# Patient Record
Sex: Male | Born: 1980 | Race: White | Hispanic: No | Marital: Married | Smoking: Never smoker
Health system: Southern US, Community
[De-identification: ages and names within clinical notes are randomized; demographics above are authoritative.]

## PROBLEM LIST (undated history)

## (undated) DIAGNOSIS — G8929 Other chronic pain: Secondary | ICD-10-CM

## (undated) DIAGNOSIS — M549 Dorsalgia, unspecified: Secondary | ICD-10-CM

## (undated) DIAGNOSIS — I10 Essential (primary) hypertension: Secondary | ICD-10-CM

## (undated) DIAGNOSIS — F419 Anxiety disorder, unspecified: Secondary | ICD-10-CM

## (undated) DIAGNOSIS — M542 Cervicalgia: Secondary | ICD-10-CM

## (undated) HISTORY — PX: BACK SURGERY: SHX140

## (undated) HISTORY — PX: CARPAL TUNNEL RELEASE: SHX101

## (undated) HISTORY — PX: ULNAR NERVE TRANSPOSITION: SHX2595

## (undated) HISTORY — PX: CERVICAL SPINE SURGERY: SHX589

---

## 2008-10-20 ENCOUNTER — Encounter: Admission: RE | Admit: 2008-10-20 | Discharge: 2008-10-20 | Payer: Self-pay | Admitting: Orthopaedic Surgery

## 2008-11-09 ENCOUNTER — Inpatient Hospital Stay (HOSPITAL_COMMUNITY): Admission: RE | Admit: 2008-11-09 | Discharge: 2008-11-10 | Payer: Self-pay | Admitting: Orthopedic Surgery

## 2008-11-09 ENCOUNTER — Encounter (INDEPENDENT_AMBULATORY_CARE_PROVIDER_SITE_OTHER): Payer: Self-pay | Admitting: Orthopedic Surgery

## 2010-01-23 IMAGING — CR DG CERVICAL SPINE 2 OR 3 VIEWS
1 series · 1 of 1 positions shown · non-contrast
Comparison: CT scan of the cervical spine dated 10/20/2008

CLINICAL DATA: Degenerative disc disease at C6-7 and C7-T1.

CERVICAL SPINE - 2-3 VIEW

[view not recorded]
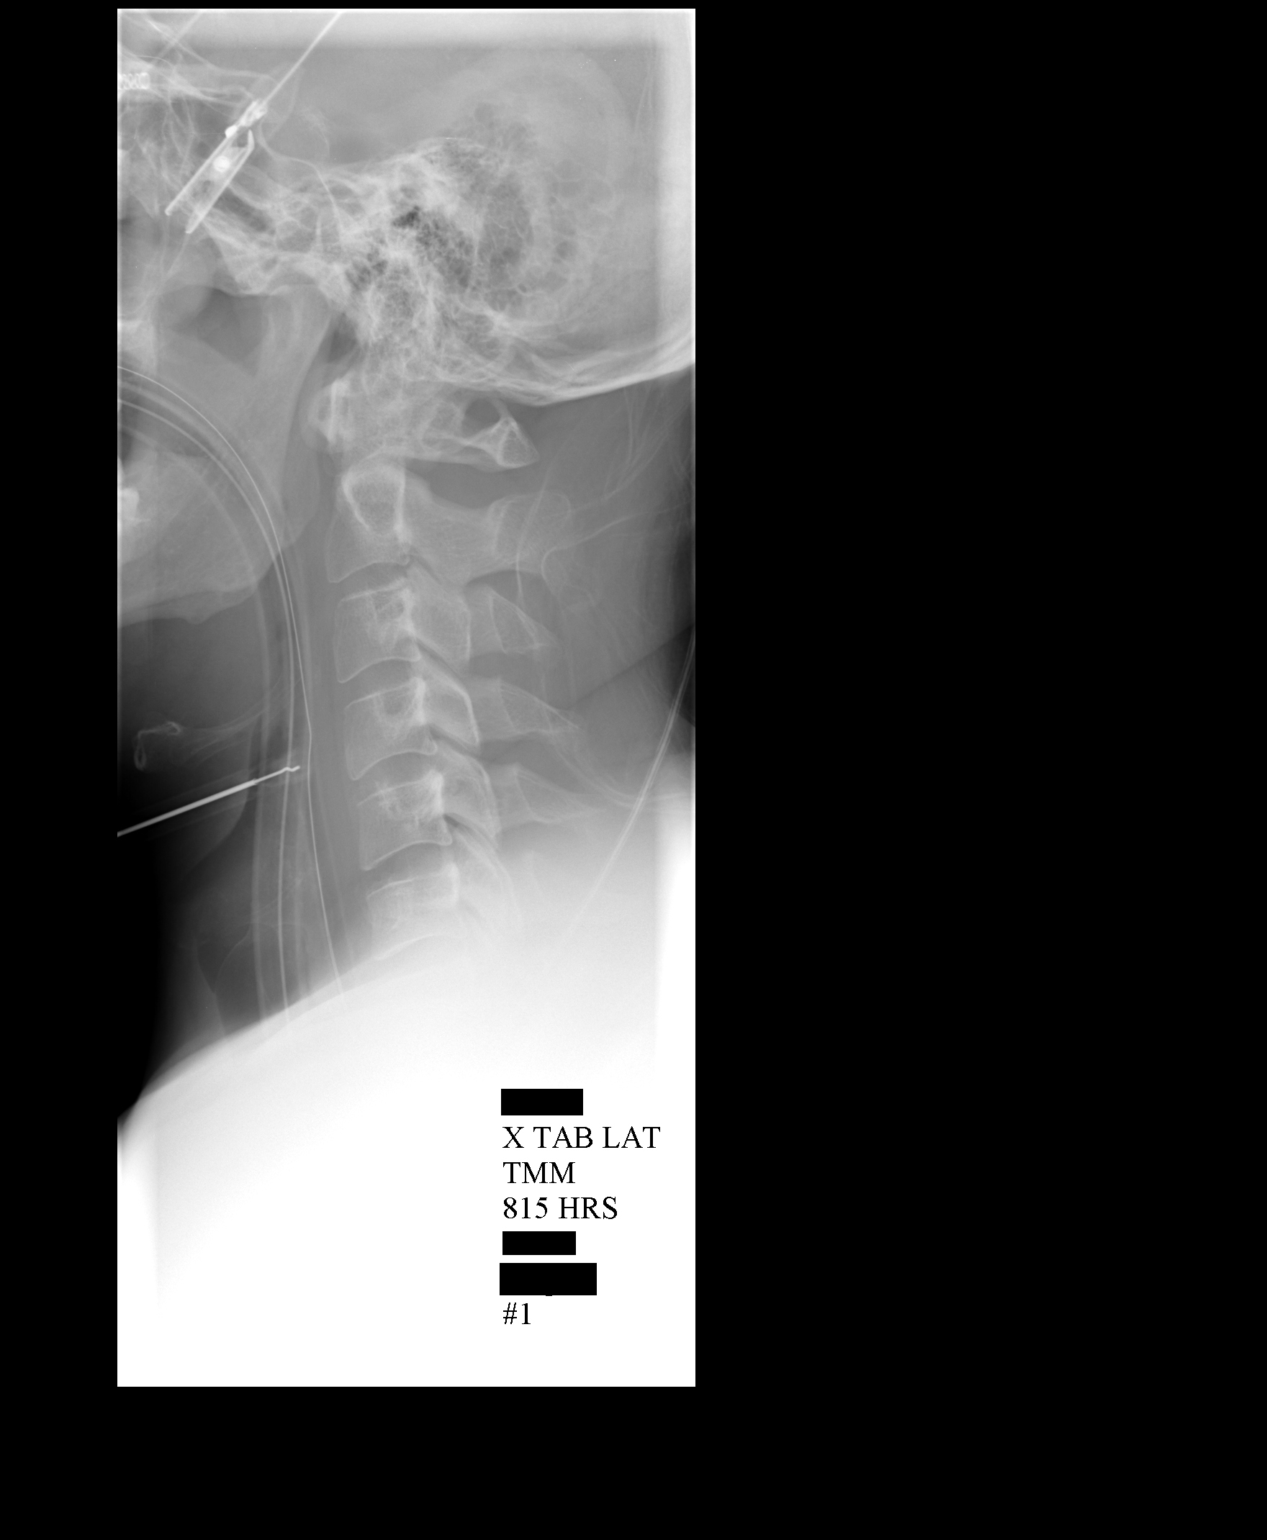

[1 of 1 positions shown; findings below may reference images not displayed]

FINDINGS: Radiograph #1 demonstrates a needle at the C4-5 level.
Radiograph #2 demonstrates a needle at the C7 level.  Radiograph #3
demonstrates anterior cervical fusion at C6-7.  The C7-T1 level is
not visible on this radiograph.
IMPRESSION: Anterior cervical fusion at C6-7.  C7-T1 level is not visible on
the radiographs.

## 2010-07-08 LAB — TYPE AND SCREEN
ABO/RH(D): O POS
Antibody Screen: NEGATIVE

## 2010-07-08 LAB — CBC
HCT: 45.2 % (ref 39.0–52.0)
MCHC: 34.3 g/dL (ref 30.0–36.0)
MCHC: 34.8 g/dL (ref 30.0–36.0)
MCV: 90 fL (ref 78.0–100.0)
MCV: 91.1 fL (ref 78.0–100.0)
Platelets: 189 10*3/uL (ref 150–400)
Platelets: 234 10*3/uL (ref 150–400)
RBC: 4.36 MIL/uL (ref 4.22–5.81)
RDW: 12.6 % (ref 11.5–15.5)
RDW: 12.9 % (ref 11.5–15.5)

## 2010-07-08 LAB — BASIC METABOLIC PANEL
BUN: 6 mg/dL (ref 6–23)
CO2: 30 mEq/L (ref 19–32)
Calcium: 8.7 mg/dL (ref 8.4–10.5)
Chloride: 107 mEq/L (ref 96–112)
Creatinine, Ser: 0.81 mg/dL (ref 0.4–1.5)
GFR calc Af Amer: >60 mL/min (ref >60)

## 2010-07-08 LAB — COMPREHENSIVE METABOLIC PANEL
Albumin: 4.4 g/dL (ref 3.5–5.2)
BUN: 9 mg/dL (ref 6–23)
Calcium: 9.6 mg/dL (ref 8.4–10.5)
Creatinine, Ser: 1.07 mg/dL (ref 0.4–1.5)
Glucose, Bld: 94 mg/dL (ref 70–99)
Total Protein: 7 g/dL (ref 6.0–8.3)

## 2010-07-08 LAB — DIFFERENTIAL
Basophils Absolute: 0.1 10*3/uL (ref 0.0–0.1)
Lymphocytes Relative: 35 % (ref 12–46)
Monocytes Absolute: 0.8 10*3/uL (ref 0.1–1.0)
Monocytes Relative: 12 % (ref 3–12)
Neutro Abs: 3.4 10*3/uL (ref 1.7–7.7)
Neutrophils Relative %: 49 % (ref 43–77)

## 2010-07-08 LAB — URINALYSIS, ROUTINE W REFLEX MICROSCOPIC
Glucose, UA: NEGATIVE mg/dL
Hgb urine dipstick: NEGATIVE
Protein, ur: NEGATIVE mg/dL
Specific Gravity, Urine: 1.026 (ref 1.005–1.030)
Urobilinogen, UA: 1 mg/dL (ref 0.0–1.0)

## 2010-08-15 NOTE — Discharge Summary (Signed)
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   BRIEF HISTORY:  The patient was brought into [facility] on  November 09, 2008, for anterior decompression fusion C6-7, C7-T1 with  iliac crest bone graft harvest.  Estimated blood loss 150 mL.  Postoperatively, he was grossly neurovascularly motor intact.  He has  decreased sensation in left C8 distribution.  Postop day #1, he did have  difficulty with independent voiding after the Foley was discontinued  approximately 5 p.m. on November 09, 2008.  They re-cathetered Mr.  Juhnke.  Today, we will try dose of Flomax 0.4 mg daily, continue IV  fluids until voiding is successful independently, if so we will  discontinue the IV fluids and discontinue IV access.  He is grossly  neurovascularly motor intact this morning, still has decreased sensation  in C8 distribution down to the fifth digit on the left upper extremity.  Incision is clean, dry, and intact.  We discontinued the drain, only 10  mL of drainage output since surgery, put a compression dressing over the  drain hole.  He is instructed to leave that in place for 24 hours and  then he may shower.  He is afebrile.  Vital signs were stable.  His  vitamin D level is within the normal range of 50.  Iliac crest graft  harvest site is clean, dry, and intact.  No dressing is needed at this  point.  Steri-Strips in place on both incisions.  Active range of motion  of upper extremities is intact and grip strength is grossly 5/5.   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   PLAN:  He will follow up in our office in 1 week.   Diet is soft for 6  days, then regular as tolerated.  He may shower after 24 hours removing  dressing prior.  He will be discharged home today if he can  independently void and follow up in our office in approximately 1 week.  We want him to walk for exercise.  No bending, stooping, lifting.  I am  sending him home with prescriptions to include Percocet 10, 1-2 q.4  p.r.n. for breakthrough pain and OxyContin 20 one p.o. q.12 p.r.n. for  pain, and I gave him 150 of the Percocet and 60 of the OxyContin.  When  he gets low on the Percocet, he will call his regular medical doctor for  refills on the Percocet.  He is going to continue all of his home  medications as directed.      Lianne Cure, P.A.      Nelda Severe, MD  Electronically Signed    MC/MEDQ  D:  11/10/2008  T:  11/10/2008  Job:  [id number]

## 2010-08-15 NOTE — Op Note (Signed)
Craig Glover, Craig Glover                ACCOUNT NO.:  [id number]   MEDICAL RECORD NO.:  [id number]          PATIENT TYPE:  INP   LOCATION:  5040                         FACILITY:  [facility]   PHYSICIAN:  Nelda Severe, MD      DATE OF BIRTH:  17-Jan-1981   DATE OF PROCEDURE:  11/09/2008  DATE OF DISCHARGE:                               OPERATIVE REPORT   SURGEON:  Nelda Severe, MD   ASSISTANT:  Lianne Cure, PA-C   PREOPERATIVE DIAGNOSES:  C7-T1 left foraminal stenosis, possible disk  herniation; C6-7 annular tear.   POSTOPERATIVE DIAGNOSES:  C7-T1 foraminal stenosis, C6-7 annular tear.   OPERATIVE FINDINGS:  At C7-C1, I did not identify disk fragment.  However, there was a severe foraminal stenosis secondary to joint  hypertrophy of the left uncovertebral joint.  At C6-7, there was no  identifiable disk herniation, but there was interestingly a large rent  in the annulus posteriorly with a gap of 2-3 mm, through which no disk  had extruded.   OPERATIVE PROCEDURES:  Anterior C7-T1 decompression with foraminotomy,  left side and C7-T1 interbody fusion, C6-7 interbody fusion using PEEK  cages, autograft, titanium plate and screws; harvest autograft, left  anterior iliac crest.   OPERATIVE NOTE:  The patient was placed under general endotracheal  anesthesia.  The antibiotics had been infused intravenously as  prophylaxis for infection.  He was positioned supine on the [place] flat  top radiolucent table.  The head was supported on a foam donut.  The  arms were tucked at the sides.  The left anterior iliac crest area was  draped after preparation with DuraPrep.  The anterior cervical area was  prepped with DuraPrep and the drapes secured with Ioban.   Prior to placing the drapes, I had taped a spinal needle to the skin of  the neck on the left side and marked position of the needle with a skin  marker.  Cross-table lateral radiograph was taken, which showed the  needle to be at about  C4 position.   The time-out was held after preparation and draping.  The usual  information was confirmed.   A slightly oblique incision was made from midline to a point about the 3-  4 cm lateral on the left side and about 2 cm above the sternal notch.  The sternocleidomastoid muscle was identified after incising through the  platysma.  The anterior border was dissected out and the deep cervical  fascia bluntly dissected down onto the anterior aspect of the vertebral  column.  The patient was very muscular large male, and the wound was  actually quite deep.  A needle was placed in the disk, and a cross-table  lateral radiograph taken.  The last disk which was clearly visible on  the cross-table radiograph was C6-7, and the needle appeared to be in  the disk below.  Ultimately, another radiograph was taken with 2 marker  needles, 1 in what proved to be C6-7 and 1 in the level below, which  could not be identified very clearly on the radiographs.  This was  discussed with Dr. Lacy Duverney, radiologist who was reviewing the images  currently.   We started at the C7-T1 level.  The longus colli muscles were mobilized  bilaterally.  The deepest blades from the Shadow-Line retractor were  used to retract the midline structures to the right and the carotid  sheath and sternocleidomastoid to the left.  Caspar pins were placed at  T1 and C7, and the distractor applied, and tension applied to the disk.  The anterior disk was incised, and the contents curetted out and removed  with a combination of pituitary rongeurs and Kerrison rongeur.  When we  had worked our way back to the posterior portion of the endplates above  and below, high-speed bur was used to bur away some of the endplate  particularly on the left side.  Further curettage was carried out, and  we were able to enter the spinal canal.  A 2-mm Kerrison rongeur was  then used to remove some of the posterior lip of the vertebra on the  left  side.  Ultimately, we were able to exposed dura and worked our way  laterally.  The neural foramen was very tight, would not admit a nerve  hook.  We then proceeded very tediously to remove bone and expand the  neural foramen using a 2-mm Kerrison rongeur.  Ultimately, the neural  foramen appeared adequately decompressed on the basis of palpation with  a nerve hook.  At this point, efforts to further decompress were ceased  because I felt that we were very far lateral and really not able to  visualize very well the bone that we were removing.  The endplates were  further prepared by denuding them of cartilage.  A 7-mm thick lordotic  PEEK spacer was then chosen.   Not mentioned above was that we had harvested bone graft in the left  anterior iliac crest while waiting for x-ray processing.  This was done  by making a short oblique incision just distal to the crest, 2-3 inches  behind the posterior superior iliac spine.  Dissection was carried down  onto the crest.  A window was made in the crest with a high-speed bur  and then satisfactory amount of cancellous bone graft removed from  between the tables of the ilium with a curette.  The defect was packed  with a piece of Gelfoam.  The external oblique fascia was reopposed  using a figure-of-eight 0 Vicryl suture.  The subcutaneous tissue was  closed using inverted 2-0 undyed Vicryl, and the skin was closed using  subcuticular 3-0 running undyed Vicryl suture.  Ultimately, skin edges  were reinforced with Steri-Strips, and antibiotic ointment dressing  applied at the end of the procedure.   Having loaded the cage for C7-T1, it was gently impacted and countersunk  about 2 mm.  The distractor was let off and the endplates gripped the  cage nicely.   We then moved to the C6-7, one space proximally.  This required some  more mobilization of the longus colli muscles and replacement of the  retractor, along with removing the Caspar distraction  pins at C7 and T1.  Caspar distractor pins were replaced after placing a Shadow-Line  retractor at C6-7, the C7 pin in the previously made hole and the C6 pin  in a new hole in the midbody of the C6.  Distraction was applied.  The  annulus was incised.  The disk was enucleated with curettes.  As we  enucleated the posterior  portion of the disk, it was obvious there was a  rent in the annulus, as detected on the MRI scan, and its appearance as  described above.  The endplates were prepared by denuding them of  cartilage.  A 7-mm spacer, PEEK cage (was chosen) and packed with bone  graft and impacted into place gently and countersunk about 1-2 mm.  Extra bone was packed laterally at both levels.  We then chose an  appropriate length titanium plate and contoured it for lordosis.  It was  then attached with titanium screws, 2 at C6, 2 at C7, and 2 at T1.  Cross-table lateral radiograph showed only the C6 body and the screws at  that level appeared to be in good position, and this did confirm that we  were at the right levels.   Having set all the screws, so they would interlock with the plate, we  placed a 7-gauge Silastic drain and brought it out through the skin to  the right side.  The platysma layer was closed using interrupted #2-0  Vicryl suture.  The skin was closed using subcuticular 3-0 undyed Vicryl  in running fashion.  The drain was secured with a 4-0 nylon suture.  The  skin edges were reinforced with Steri-Strips and dressing applied to be  secured with a surgical towel.   Blood loss estimated at 150 mL or less.  No intraoperative  complications.  The patient has not been examined postoperatively as he  has not been transferred to recovery room yet at the time of this  dictation.      Nelda Severe, MD  Electronically Signed     MT/MEDQ  D:  11/09/2008  T:  11/09/2008  Job:  [id number]

## 2013-12-27 ENCOUNTER — Emergency Department (HOSPITAL_COMMUNITY)
Admission: EM | Admit: 2013-12-27 | Discharge: 2013-12-27 | Disposition: A | Discharge status: Home / Self Care | Payer: No Typology Code available for payment source | Attending: Emergency Medicine | Admitting: Emergency Medicine

## 2013-12-27 ENCOUNTER — Emergency Department (HOSPITAL_COMMUNITY): Payer: No Typology Code available for payment source

## 2013-12-27 ENCOUNTER — Encounter (HOSPITAL_COMMUNITY): Payer: Self-pay | Admitting: Emergency Medicine

## 2013-12-27 DIAGNOSIS — S01501A Unspecified open wound of lip, initial encounter: Secondary | ICD-10-CM | POA: Insufficient documentation

## 2013-12-27 DIAGNOSIS — Y9389 Activity, other specified: Secondary | ICD-10-CM | POA: Diagnosis not present

## 2013-12-27 DIAGNOSIS — T148XXA Other injury of unspecified body region, initial encounter: Secondary | ICD-10-CM

## 2013-12-27 DIAGNOSIS — IMO0002 Reserved for concepts with insufficient information to code with codable children: Secondary | ICD-10-CM | POA: Diagnosis not present

## 2013-12-27 DIAGNOSIS — S139XXA Sprain of joints and ligaments of unspecified parts of neck, initial encounter: Secondary | ICD-10-CM | POA: Insufficient documentation

## 2013-12-27 DIAGNOSIS — Y92410 Unspecified street and highway as the place of occurrence of the external cause: Secondary | ICD-10-CM | POA: Insufficient documentation

## 2013-12-27 DIAGNOSIS — Z9889 Other specified postprocedural states: Secondary | ICD-10-CM | POA: Diagnosis not present

## 2013-12-27 DIAGNOSIS — I10 Essential (primary) hypertension: Secondary | ICD-10-CM | POA: Diagnosis not present

## 2013-12-27 DIAGNOSIS — S161XXA Strain of muscle, fascia and tendon at neck level, initial encounter: Secondary | ICD-10-CM

## 2013-12-27 DIAGNOSIS — G8929 Other chronic pain: Secondary | ICD-10-CM | POA: Diagnosis not present

## 2013-12-27 DIAGNOSIS — S1093XA Contusion of unspecified part of neck, initial encounter: Secondary | ICD-10-CM | POA: Diagnosis not present

## 2013-12-27 DIAGNOSIS — S0990XA Unspecified injury of head, initial encounter: Secondary | ICD-10-CM | POA: Diagnosis present

## 2013-12-27 DIAGNOSIS — S060X0A Concussion without loss of consciousness, initial encounter: Secondary | ICD-10-CM | POA: Insufficient documentation

## 2013-12-27 DIAGNOSIS — S0003XA Contusion of scalp, initial encounter: Secondary | ICD-10-CM | POA: Diagnosis not present

## 2013-12-27 DIAGNOSIS — S01511A Laceration without foreign body of lip, initial encounter: Secondary | ICD-10-CM

## 2013-12-27 DIAGNOSIS — S0083XA Contusion of other part of head, initial encounter: Secondary | ICD-10-CM | POA: Insufficient documentation

## 2013-12-27 HISTORY — DX: Essential (primary) hypertension: I10

## 2013-12-27 HISTORY — DX: Cervicalgia: M54.2

## 2013-12-27 HISTORY — DX: Dorsalgia, unspecified: M54.9

## 2013-12-27 HISTORY — DX: Anxiety disorder, unspecified: F41.9

## 2013-12-27 HISTORY — DX: Other chronic pain: G89.29

## 2013-12-27 LAB — CBC
HEMATOCRIT: 49.9 % (ref 39.0–52.0)
HEMOGLOBIN: 17.6 g/dL — AB (ref 13.0–17.0)
MCH: 31 pg (ref 26.0–34.0)
MCHC: 35.3 g/dL (ref 30.0–36.0)
MCV: 87.9 fL (ref 78.0–100.0)
Platelets: 269 10*3/uL (ref 150–400)
RBC: 5.68 MIL/uL (ref 4.22–5.81)
RDW: 13.4 % (ref 11.5–15.5)
WBC: 10.9 10*3/uL — ABNORMAL HIGH (ref 4.0–10.5)

## 2013-12-27 LAB — BASIC METABOLIC PANEL
Anion gap: 13 (ref 5–15)
BUN: 12 mg/dL (ref 6–23)
CALCIUM: 9.3 mg/dL (ref 8.4–10.5)
CO2: 25 mEq/L (ref 19–32)
Chloride: 102 mEq/L (ref 96–112)
Creatinine, Ser: 1.11 mg/dL (ref 0.50–1.35)
GFR calc Af Amer: >90 mL/min (ref >90)
GFR, EST NON AFRICAN AMERICAN: 86 mL/min — AB (ref >90)
GLUCOSE: 98 mg/dL (ref 70–99)
Potassium: 4.6 mEq/L (ref 3.7–5.3)
Sodium: 140 mEq/L (ref 137–147)

## 2013-12-27 MED ORDER — HYDROMORPHONE HCL 1 MG/ML IJ SOLN
1.0000 mg | Freq: Once | INTRAMUSCULAR | Status: AC
  Administered 2013-12-27: 1 mg via INTRAVENOUS
  Filled 2013-12-27: qty 1

## 2013-12-27 MED ORDER — DIAZEPAM 5 MG PO TABS: 5.0000 mg | ORAL_TABLET | Freq: Three times a day (TID) | ORAL | Status: DC | PRN

## 2013-12-27 MED ORDER — IBUPROFEN 800 MG PO TABS: 800.0000 mg | ORAL_TABLET | Freq: Three times a day (TID) | ORAL | Status: AC

## 2013-12-27 MED ORDER — CLINDAMYCIN HCL 150 MG PO CAPS: 300.0000 mg | ORAL_CAPSULE | Freq: Three times a day (TID) | ORAL | Status: AC

## 2013-12-27 MED ORDER — LIDOCAINE HCL (PF) 1 % IJ SOLN
5.0000 mL | Freq: Once | INTRAMUSCULAR | Status: AC
  Administered 2013-12-27: 5 mL via INTRADERMAL
  Filled 2013-12-27: qty 5

## 2013-12-27 MED ORDER — OXYCODONE-ACETAMINOPHEN 5-325 MG PO TABS: 2.0000 | ORAL_TABLET | Freq: Four times a day (QID) | ORAL | Status: AC | PRN

## 2013-12-27 MED ORDER — KETOROLAC TROMETHAMINE 30 MG/ML IJ SOLN
30.0000 mg | Freq: Once | INTRAMUSCULAR | Status: AC
  Administered 2013-12-27: 30 mg via INTRAVENOUS
  Filled 2013-12-27: qty 1

## 2013-12-27 MED ORDER — FENTANYL CITRATE 0.05 MG/ML IJ SOLN
100.0000 ug | Freq: Once | INTRAMUSCULAR | Status: AC
  Administered 2013-12-27: 100 ug via INTRAVENOUS
  Filled 2013-12-27: qty 2

## 2013-12-27 MED ORDER — DIAZEPAM 5 MG/ML IJ SOLN
5.0000 mg | Freq: Once | INTRAMUSCULAR | Status: AC
  Administered 2013-12-27: 5 mg via INTRAVENOUS
  Filled 2013-12-27: qty 2
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
## 2013-12-27 NOTE — Discharge Instructions (Signed)
You need to follow-up with your primary care provider in 5 days for suture removal. You need to keep the cervical collar in place until seen by orthopedics for follow-up (3-5 days)  Concussion A concussion, or closed-head injury, is a brain injury caused by a direct blow to the head or by a quick and sudden movement (jolt) of the head or neck. Concussions are usually not life-threatening. Even so, the effects of a concussion can be serious. If you have had a concussion before, you are more likely to experience concussion-like symptoms after a direct blow to the head.  CAUSES  Direct blow to the head, such as from running into another player during a soccer game, being hit in a fight, or hitting your head on a hard surface.  A jolt of the head or neck that causes the brain to move back and forth inside the skull, such as in a car crash. SIGNS AND SYMPTOMS The signs of a concussion can be hard to notice. Early on, they may be missed by you, family members, and health care providers. You may look fine but act or feel differently. Symptoms are usually temporary, but they may last for days, weeks, or even longer. Some symptoms may appear right away while others may not show up for hours or days. Every head injury is different. Symptoms include:  Mild to moderate headaches that will not go away.  A feeling of pressure inside your head.  Having more trouble than usual:  Learning or remembering things you have heard.  Answering questions.  Paying attention or concentrating.  Organizing daily tasks.  Making decisions and solving problems.  Slowness in thinking, acting or reacting, speaking, or reading.  Getting lost or being easily confused.  Feeling tired all the time or lacking energy (fatigued).  Feeling drowsy.  Sleep disturbances.  Sleeping more than usual.  Sleeping less than usual.  Trouble falling asleep.  Trouble sleeping (insomnia).  Loss of balance or feeling  lightheaded or dizzy.  Nausea or vomiting.  Numbness or tingling.  Increased sensitivity to:  Sounds.  Lights.  Distractions.  Vision problems or eyes that tire easily.  Diminished sense of taste or smell.  Ringing in the ears.  Mood changes such as feeling sad or anxious.  Becoming easily irritated or angry for little or no reason.  Lack of motivation.  Seeing or hearing things other people do not see or hear (hallucinations). DIAGNOSIS Your health care provider can usually diagnose a concussion based on a description of your injury and symptoms. He or she will ask whether you passed out (lost consciousness) and whether you are having trouble remembering events that happened right before and during your injury. Your evaluation might include:  A brain scan to look for signs of injury to the brain. Even if the test shows no injury, you may still have a concussion.  Blood tests to be sure other problems are not present. TREATMENT  Concussions are usually treated in an emergency department, in urgent care, or at a clinic. You may need to stay in the hospital overnight for further treatment.  Tell your health care provider if you are taking any medicines, including prescription medicines, over-the-counter medicines, and natural remedies. Some medicines, such as blood thinners (anticoagulants) and aspirin, may increase the chance of complications. Also tell your health care provider whether you have had alcohol or are taking illegal drugs. This information may affect treatment.  Your health care provider will send you home with  important instructions to follow.  How fast you will recover from a concussion depends on many factors. These factors include how severe your concussion is, what part of your brain was injured, your age, and how healthy you were before the concussion.  Most people with mild injuries recover fully. Recovery can take time. In general, recovery is slower in  older persons. Also, persons who have had a concussion in the past or have other medical problems may find that it takes longer to recover from their current injury. HOME CARE INSTRUCTIONS General Instructions  Carefully follow the directions your health care provider gave you.  Only take over-the-counter or prescription medicines for pain, discomfort, or fever as directed by your health care provider.  Take only those medicines that your health care provider has approved.  Do not drink alcohol until your health care provider says you are well enough to do so. Alcohol and certain other drugs may slow your recovery and can put you at risk of further injury.  If it is harder than usual to remember things, write them down.  If you are easily distracted, try to do one thing at a time. For example, do not try to watch TV while fixing dinner.  Talk with family members or close friends when making important decisions.  Keep all follow-up appointments. Repeated evaluation of your symptoms is recommended for your recovery.  Watch your symptoms and tell others to do the same. Complications sometimes occur after a concussion. Older adults with a brain injury may have a higher risk of serious complications, such as a blood clot on the brain.  Tell your teachers, school nurse, school counselor, coach, athletic trainer, or work Production designer, theatre/television/filmmanager about your injury, symptoms, and restrictions. Tell them about what you can or cannot do. They should watch for:  Increased problems with attention or concentration.  Increased difficulty remembering or learning new information.  Increased time needed to complete tasks or assignments.  Increased irritability or decreased ability to cope with stress.  Increased symptoms.  Rest. Rest helps the brain to heal. Make sure you:  Get plenty of sleep at night. Avoid staying up late at night.  Keep the same bedtime hours on weekends and weekdays.  Rest during the day.  Take daytime naps or rest breaks when you feel tired.  Limit activities that require a lot of thought or concentration. These include:  Doing homework or job-related work.  Watching TV.  Working on the computer.  Avoid any situation where there is potential for another head injury (football, hockey, soccer, basketball, martial arts, downhill snow sports and horseback riding). Your condition will get worse every time you experience a concussion. You should avoid these activities until you are evaluated by the appropriate follow-up health care providers. Returning To Your Regular Activities You will need to return to your normal activities slowly, not all at once. You must give your body and brain enough time for recovery.  Do not return to sports or other athletic activities until your health care provider tells you it is safe to do so.  Ask your health care provider when you can drive, ride a bicycle, or operate heavy machinery. Your ability to react may be slower after a brain injury. Never do these activities if you are dizzy.  Ask your health care provider about when you can return to work or school. Preventing Another Concussion It is very important to avoid another brain injury, especially before you have recovered. In rare cases, another  injury can lead to permanent brain damage, brain swelling, or death. The risk of this is greatest during the first 7-10 days after a head injury. Avoid injuries by:  Wearing a seat belt when riding in a car.  Drinking alcohol only in moderation.  Wearing a helmet when biking, skiing, skateboarding, skating, or doing similar activities.  Avoiding activities that could lead to a second concussion, such as contact or recreational sports, until your health care provider says it is okay.  Taking safety measures in your home.  Remove clutter and tripping hazards from floors and stairways.  Use grab bars in bathrooms and handrails by stairs.  Place  non-slip mats on floors and in bathtubs.  Improve lighting in dim areas. SEEK MEDICAL CARE IF:  You have increased problems paying attention or concentrating.  You have increased difficulty remembering or learning new information.  You need more time to complete tasks or assignments than before.  You have increased irritability or decreased ability to cope with stress.  You have more symptoms than before. Seek medical care if you have any of the following symptoms for more than 2 weeks after your injury:  Lasting (chronic) headaches.  Dizziness or balance problems.  Nausea.  Vision problems.  Increased sensitivity to noise or light.  Depression or mood swings.  Anxiety or irritability.  Memory problems.  Difficulty concentrating or paying attention.  Sleep problems.  Feeling tired all the time. SEEK IMMEDIATE MEDICAL CARE IF:  You have severe or worsening headaches. These may be a sign of a blood clot in the brain.  You have weakness (even if only in one hand, leg, or part of the face).  You have numbness.  You have decreased coordination.  You vomit repeatedly.  You have increased sleepiness.  One pupil is larger than the other.  You have convulsions.  You have slurred speech.  You have increased confusion. This may be a sign of a blood clot in the brain.  You have increased restlessness, agitation, or irritability.  You are unable to recognize people or places.  You have neck pain.  It is difficult to wake you up.  You have unusual behavior changes.  You lose consciousness. MAKE SURE YOU:  Understand these instructions.  Will watch your condition.  Will get help right away if you are not doing well or get worse. Document Released: 06/09/2003 Document Revised: 03/24/2013 Document Reviewed: 10/09/2012 [facility] Patient Information 2015 [place], [place]. This information is not intended to replace advice given to you by your health care  provider. Make sure you discuss any questions you have with your health care provider. Open Wound, Lip An open wound is a break in the skin caused by an injury. An open wound to the lip can be a scrape, cut, or hole (puncture). Good wound care will help:   Lessen pain.  Prevent infection.  Lessen scarring. HOME CARE  Wash off all dirt.  Clean your wounds daily with gentle soap and water.  Eat soft foods or liquids while your wound is healing.  Rinse the wound with salt water after each meal.  Apply medicated cream after the wound has been cleaned as told by your doctor.  Follow up with your doctor as told. GET HELP RIGHT AWAY IF:   There is more redness or puffiness (swelling) in or around the wound.  There is increasing pain.  You have a temperature by mouth above 102 F (38.9 C), not controlled by medicine.  Your baby  is older than 3 months with a rectal temperature of 102 F (38.9 C) or higher.  Your baby is 35 months old or younger with a rectal temperature of 100.4 F (38 C) or higher.  There is yellowish white fluid (pus) coming from the wound.  Very bad pain develops that is not controlled with medicine.  There is a red line on the skin above or below the wound. MAKE SURE YOU:   Understand these instructions.  Will watch this condition.  Will get help right away if you are not doing well or get worse. Document Released: 06/15/2008 Document Revised: 06/11/2011 Document Reviewed: 07/05/2009 [facility] Patient Information 2015 [place], [place]. This information is not intended to replace advice given to you by your health care provider. Make sure you discuss any questions you have with your health care provider. Cervical Strain and Sprain (Whiplash) with Rehab Cervical strain and sprain are injuries that commonly occur with "whiplash" injuries. Whiplash occurs when the neck is forcefully whipped backward or forward, such as during a motor vehicle accident or  during contact sports. The muscles, ligaments, tendons, discs, and nerves of the neck are susceptible to injury when this occurs. RISK FACTORS Risk of having a whiplash injury increases if:  Osteoarthritis of the spine.  Situations that make head or neck accidents or trauma more likely.  High-risk sports (football, rugby, wrestling, hockey, auto racing, gymnastics, diving, contact karate, or boxing).  Poor strength and flexibility of the neck.  Previous neck injury.  Poor tackling technique.  Improperly fitted or padded equipment. SYMPTOMS   Pain or stiffness in the front or back of neck or both.  Symptoms may present immediately or up to 24 hours after injury.  Dizziness, headache, nausea, and vomiting.  Muscle spasm with soreness and stiffness in the neck.  Tenderness and swelling at the injury site. PREVENTION  Learn and use proper technique (avoid tackling with the head, spearing, and head-butting; use proper falling techniques to avoid landing on the head).  Warm up and stretch properly before activity.  Maintain physical fitness:  Strength, flexibility, and endurance.  Cardiovascular fitness.  Wear properly fitted and padded protective equipment, such as padded soft collars, for participation in contact sports. PROGNOSIS  Recovery from cervical strain and sprain injuries is dependent on the extent of the injury. These injuries are usually curable in 1 week to 3 months with appropriate treatment.  RELATED COMPLICATIONS   Temporary numbness and weakness may occur if the nerve roots are damaged, and this may persist until the nerve has completely healed.  Chronic pain due to frequent recurrence of symptoms.  Prolonged healing, especially if activity is resumed too soon (before complete recovery). TREATMENT  Treatment initially involves the use of ice and medication to help reduce pain and inflammation. It is also important to perform strengthening and stretching  exercises and modify activities that worsen symptoms so the injury does not get worse. These exercises may be performed at home or with a therapist. For patients who experience severe symptoms, a soft, padded collar may be recommended to be worn around the neck.  Improving your posture may help reduce symptoms. Posture improvement includes pulling your chin and abdomen in while sitting or standing. If you are sitting, sit in a firm chair with your buttocks against the back of the chair. While sleeping, try replacing your pillow with a small towel rolled to 2 inches in diameter, or use a cervical pillow or soft cervical collar. Poor sleeping positions delay  healing.  For patients with nerve root damage, which causes numbness or weakness, the use of a cervical traction apparatus may be recommended. Surgery is rarely necessary for these injuries. However, cervical strain and sprains that are present at birth (congenital) may require surgery. MEDICATION   If pain medication is necessary, nonsteroidal anti-inflammatory medications, such as aspirin and ibuprofen, or other minor pain relievers, such as acetaminophen, are often recommended.  Do not take pain medication for 7 days before surgery.  Prescription pain relievers may be given if deemed necessary by your caregiver. Use only as directed and only as much as you need. HEAT AND COLD:   Cold treatment (icing) relieves pain and reduces inflammation. Cold treatment should be applied for 10 to 15 minutes every 2 to 3 hours for inflammation and pain and immediately after any activity that aggravates your symptoms. Use ice packs or an ice massage.  Heat treatment may be used prior to performing the stretching and strengthening activities prescribed by your caregiver, physical therapist, or athletic trainer. Use a heat pack or a warm soak. SEEK MEDICAL CARE IF:   Symptoms get worse or do not improve in 2 weeks despite treatment.  New, unexplained symptoms  develop (drugs used in treatment may produce side effects). EXERCISES RANGE OF MOTION (ROM) AND STRETCHING EXERCISES - Cervical Strain and Sprain These exercises may help you when beginning to rehabilitate your injury. In order to successfully resolve your symptoms, you must improve your posture. These exercises are designed to help reduce the forward-head and rounded-shoulder posture which contributes to this condition. Your symptoms may resolve with or without further involvement from your physician, physical therapist or athletic trainer. While completing these exercises, remember:   Restoring tissue flexibility helps normal motion to return to the joints. This allows healthier, less painful movement and activity.  An effective stretch should be held for at least 20 seconds, although you may need to begin with shorter hold times for comfort.  A stretch should never be painful. You should only feel a gentle lengthening or release in the stretched tissue. STRETCH- Axial Extensors  Lie on your back on the floor. You may bend your knees for comfort. Place a rolled-up hand towel or dish towel, about 2 inches in diameter, under the part of your head that makes contact with the floor.  Gently tuck your chin, as if trying to make a "double chin," until you feel a gentle stretch at the base of your head.  Hold __________ seconds. Repeat __________ times. Complete this exercise __________ times per day.  STRETCH - Axial Extension   Stand or sit on a firm surface. Assume a good posture: chest up, shoulders drawn back, abdominal muscles slightly tense, knees unlocked (if standing) and feet hip width apart.  Slowly retract your chin so your head slides back and your chin slightly lowers. Continue to look straight ahead.  You should feel a gentle stretch in the back of your head. Be certain not to feel an aggressive stretch since this can cause headaches later.  Hold for __________ seconds. Repeat  __________ times. Complete this exercise __________ times per day. STRETCH - Cervical Side Bend   Stand or sit on a firm surface. Assume a good posture: chest up, shoulders drawn back, abdominal muscles slightly tense, knees unlocked (if standing) and feet hip width apart.  Without letting your nose or shoulders move, slowly tip your right / left ear to your shoulder until your feel a gentle stretch in the  muscles on the opposite side of your neck.  Hold __________ seconds. Repeat __________ times. Complete this exercise __________ times per day. STRETCH - Cervical Rotators   Stand or sit on a firm surface. Assume a good posture: chest up, shoulders drawn back, abdominal muscles slightly tense, knees unlocked (if standing) and feet hip width apart.  Keeping your eyes level with the ground, slowly turn your head until you feel a gentle stretch along the back and opposite side of your neck.  Hold __________ seconds. Repeat __________ times. Complete this exercise __________ times per day. RANGE OF MOTION - Neck Circles   Stand or sit on a firm surface. Assume a good posture: chest up, shoulders drawn back, abdominal muscles slightly tense, knees unlocked (if standing) and feet hip width apart.  Gently roll your head down and around from the back of one shoulder to the back of the other. The motion should never be forced or painful.  Repeat the motion 10-20 times, or until you feel the neck muscles relax and loosen. Repeat __________ times. Complete the exercise __________ times per day. STRENGTHENING EXERCISES - Cervical Strain and Sprain These exercises may help you when beginning to rehabilitate your injury. They may resolve your symptoms with or without further involvement from your physician, physical therapist, or athletic trainer. While completing these exercises, remember:   Muscles can gain both the endurance and the strength needed for everyday activities through controlled  exercises.  Complete these exercises as instructed by your physician, physical therapist, or athletic trainer. Progress the resistance and repetitions only as guided.  You may experience muscle soreness or fatigue, but the pain or discomfort you are trying to eliminate should never worsen during these exercises. If this pain does worsen, stop and make certain you are following the directions exactly. If the pain is still present after adjustments, discontinue the exercise until you can discuss the trouble with your clinician. STRENGTH - Cervical Flexors, Isometric  Face a wall, standing about 6 inches away. Place a small pillow, a ball about 6-8 inches in diameter, or a folded towel between your forehead and the wall.  Slightly tuck your chin and gently push your forehead into the soft object. Push only with mild to moderate intensity, building up tension gradually. Keep your jaw and forehead relaxed.  Hold 10 to 20 seconds. Keep your breathing relaxed.  Release the tension slowly. Relax your neck muscles completely before you start the next repetition. Repeat __________ times. Complete this exercise __________ times per day. STRENGTH- Cervical Lateral Flexors, Isometric   Stand about 6 inches away from a wall. Place a small pillow, a ball about 6-8 inches in diameter, or a folded towel between the side of your head and the wall.  Slightly tuck your chin and gently tilt your head into the soft object. Push only with mild to moderate intensity, building up tension gradually. Keep your jaw and forehead relaxed.  Hold 10 to 20 seconds. Keep your breathing relaxed.  Release the tension slowly. Relax your neck muscles completely before you start the next repetition. Repeat __________ times. Complete this exercise __________ times per day. STRENGTH - Cervical Extensors, Isometric   Stand about 6 inches away from a wall. Place a small pillow, a ball about 6-8 inches in diameter, or a folded towel  between the back of your head and the wall.  Slightly tuck your chin and gently tilt your head back into the soft object. Push only with mild to moderate intensity,  building up tension gradually. Keep your jaw and forehead relaxed.  Hold 10 to 20 seconds. Keep your breathing relaxed.  Release the tension slowly. Relax your neck muscles completely before you start the next repetition. Repeat __________ times. Complete this exercise __________ times per day. POSTURE AND BODY MECHANICS CONSIDERATIONS - Cervical Strain and Sprain Keeping correct posture when sitting, standing or completing your activities will reduce the stress put on different body tissues, allowing injured tissues a chance to heal and limiting painful experiences. The following are general guidelines for improved posture. Your physician or physical therapist will provide you with any instructions specific to your needs. While reading these guidelines, remember:  The exercises prescribed by your provider will help you have the flexibility and strength to maintain correct postures.  The correct posture provides the optimal environment for your joints to work. All of your joints have less wear and tear when properly supported by a spine with good posture. This means you will experience a healthier, less painful body.  Correct posture must be practiced with all of your activities, especially prolonged sitting and standing. Correct posture is as important when doing repetitive low-stress activities (typing) as it is when doing a single heavy-load activity (lifting). PROLONGED STANDING WHILE SLIGHTLY LEANING FORWARD When completing a task that requires you to lean forward while standing in one place for a long time, place either foot up on a stationary 2- to 4-inch high object to help maintain the best posture. When both feet are on the ground, the low back tends to lose its slight inward curve. If this curve flattens (or becomes too  large), then the back and your other joints will experience too much stress, fatigue more quickly, and can cause pain.  RESTING POSITIONS Consider which positions are most painful for you when choosing a resting position. If you have pain with flexion-based activities (sitting, bending, stooping, squatting), choose a position that allows you to rest in a less flexed posture. You would want to avoid curling into a fetal position on your side. If your pain worsens with extension-based activities (prolonged standing, working overhead), avoid resting in an extended position such as sleeping on your stomach. Most people will find more comfort when they rest with their spine in a more neutral position, neither too rounded nor too arched. Lying on a non-sagging bed on your side with a pillow between your knees, or on your back with a pillow under your knees will often provide some relief. Keep in mind, being in any one position for a prolonged period of time, no matter how correct

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
## 2013-12-27 NOTE — ED Notes (Signed)
Milky drainage noted in patients right eye. Melina Schools, PA notified.
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
## 2013-12-28 NOTE — ED Provider Notes (Signed)
Medical screening examination/treatment/procedure(s) were performed by non-physician practitioner and as supervising physician I was immediately available for consultation/collaboration.   EKG Interpretation None       Hendy Brindle, MD 12/28/13 0144 
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
## 2013-12-30 ENCOUNTER — Encounter (HOSPITAL_COMMUNITY): Payer: Self-pay | Admitting: Emergency Medicine

## 2013-12-30 ENCOUNTER — Emergency Department (HOSPITAL_COMMUNITY)
Admission: EM | Admit: 2013-12-30 | Discharge: 2013-12-30 | Disposition: A | Discharge status: Home / Self Care | Payer: No Typology Code available for payment source | Attending: Emergency Medicine | Admitting: Emergency Medicine

## 2013-12-30 ENCOUNTER — Emergency Department (HOSPITAL_COMMUNITY): Payer: No Typology Code available for payment source

## 2013-12-30 DIAGNOSIS — F411 Generalized anxiety disorder: Secondary | ICD-10-CM | POA: Diagnosis not present

## 2013-12-30 DIAGNOSIS — I10 Essential (primary) hypertension: Secondary | ICD-10-CM | POA: Diagnosis not present

## 2013-12-30 DIAGNOSIS — Y9389 Activity, other specified: Secondary | ICD-10-CM | POA: Diagnosis not present

## 2013-12-30 DIAGNOSIS — Z792 Long term (current) use of antibiotics: Secondary | ICD-10-CM | POA: Insufficient documentation

## 2013-12-30 DIAGNOSIS — Z9889 Other specified postprocedural states: Secondary | ICD-10-CM | POA: Diagnosis not present

## 2013-12-30 DIAGNOSIS — S99929A Unspecified injury of unspecified foot, initial encounter: Secondary | ICD-10-CM | POA: Diagnosis present

## 2013-12-30 DIAGNOSIS — Z79899 Other long term (current) drug therapy: Secondary | ICD-10-CM | POA: Diagnosis not present

## 2013-12-30 DIAGNOSIS — Z880 Allergy status to penicillin: Secondary | ICD-10-CM | POA: Insufficient documentation

## 2013-12-30 DIAGNOSIS — Y92410 Unspecified street and highway as the place of occurrence of the external cause: Secondary | ICD-10-CM | POA: Insufficient documentation

## 2013-12-30 DIAGNOSIS — S0993XA Unspecified injury of face, initial encounter: Secondary | ICD-10-CM | POA: Insufficient documentation

## 2013-12-30 DIAGNOSIS — S8990XA Unspecified injury of unspecified lower leg, initial encounter: Secondary | ICD-10-CM | POA: Diagnosis present

## 2013-12-30 DIAGNOSIS — Z791 Long term (current) use of non-steroidal anti-inflammatories (NSAID): Secondary | ICD-10-CM | POA: Diagnosis not present

## 2013-12-30 DIAGNOSIS — S199XXA Unspecified injury of neck, initial encounter: Secondary | ICD-10-CM

## 2013-12-30 DIAGNOSIS — IMO0002 Reserved for concepts with insufficient information to code with codable children: Secondary | ICD-10-CM | POA: Insufficient documentation

## 2013-12-30 DIAGNOSIS — S99919A Unspecified injury of unspecified ankle, initial encounter: Secondary | ICD-10-CM

## 2013-12-30 DIAGNOSIS — R0602 Shortness of breath: Secondary | ICD-10-CM | POA: Diagnosis not present

## 2013-12-30 DIAGNOSIS — S298XXA Other specified injuries of thorax, initial encounter: Secondary | ICD-10-CM | POA: Diagnosis not present

## 2013-12-30 DIAGNOSIS — M7989 Other specified soft tissue disorders: Secondary | ICD-10-CM

## 2013-12-30 DIAGNOSIS — T148XXA Other injury of unspecified body region, initial encounter: Secondary | ICD-10-CM | POA: Insufficient documentation

## 2013-12-30 LAB — CBC
HEMATOCRIT: 50.7 % (ref 39.0–52.0)
Hemoglobin: 17.3 g/dL — ABNORMAL HIGH (ref 13.0–17.0)
MCH: 30.8 pg (ref 26.0–34.0)
MCHC: 34.1 g/dL (ref 30.0–36.0)
MCV: 90.4 fL (ref 78.0–100.0)
Platelets: 307 10*3/uL (ref 150–400)
RBC: 5.61 MIL/uL (ref 4.22–5.81)
RDW: 13.3 % (ref 11.5–15.5)
WBC: 8.3 10*3/uL (ref 4.0–10.5)

## 2013-12-30 LAB — BASIC METABOLIC PANEL
Anion gap: 11 (ref 5–15)
BUN: 12 mg/dL (ref 6–23)
CO2: 28 mEq/L (ref 19–32)
CREATININE: 1.33 mg/dL (ref 0.50–1.35)
Calcium: 9.1 mg/dL (ref 8.4–10.5)
Chloride: 102 mEq/L (ref 96–112)
GFR calc Af Amer: 80 mL/min — ABNORMAL LOW (ref >90)
GFR, EST NON AFRICAN AMERICAN: 69 mL/min — AB (ref >90)
GLUCOSE: 86 mg/dL (ref 70–99)
POTASSIUM: 4.5 meq/L (ref 3.7–5.3)
Sodium: 141 mEq/L (ref 137–147)

## 2013-12-30 LAB — I-STAT TROPONIN, ED: TROPONIN I, POC: 0 ng/mL (ref 0.00–0.08)

## 2013-12-30 LAB — PRO B NATRIURETIC PEPTIDE: Pro B Natriuretic peptide (BNP): <5 pg/mL (ref 0–125)

## 2013-12-30 LAB — D-DIMER, QUANTITATIVE: D-Dimer, Quant: <0.27 ug/mL-FEU (ref 0.00–0.48)

## 2013-12-30 MED ORDER — DIAZEPAM 5 MG PO TABS: ORAL_TABLET | ORAL | Status: AC

## 2013-12-30 MED ORDER — HYDROMORPHONE HCL 1 MG/ML IJ SOLN
1.0000 mg | Freq: Once | INTRAMUSCULAR | Status: AC
  Administered 2013-12-30: 1 mg via INTRAVENOUS
  Filled 2013-12-30: qty 1

## 2013-12-30 MED ORDER — ONDANSETRON HCL 4 MG/2ML IJ SOLN
4.0000 mg | Freq: Once | INTRAMUSCULAR | Status: AC
  Administered 2013-12-30: 4 mg via INTRAVENOUS
  Filled 2013-12-30: qty 2
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
## 2013-12-30 NOTE — ED Provider Notes (Signed)
Medical screening examination/treatment/procedure(s) were performed by non-physician practitioner and as supervising physician I was immediately available for consultation/collaboration.   EKG Interpretation   Date/Time:  Wednesday December 30 2013 11:36:36 EDT Ventricular Rate:  73 PR Interval:  136 QRS Duration: 130 QT Interval:  356 QTC Calculation: 392 R Axis:   -32 Text Interpretation:  Normal sinus rhythm Left axis deviation Right bundle  branch block Moderate voltage criteria for LVH, may be normal variant  Abnormal ECG Similar to prior Confirmed by Gwendolyn GrantWALDEN  MD, Analiza Cowger ([id number]) on  12/30/2013 1:08:34 PM        Craig MochaBlair Lashanta Elbe, MD 12/30/13 1408

## 2014-07-06 DIAGNOSIS — M5442 Lumbago with sciatica, left side: Secondary | ICD-10-CM | POA: Diagnosis not present

## 2014-07-06 DIAGNOSIS — I10 Essential (primary) hypertension: Secondary | ICD-10-CM | POA: Diagnosis not present

## 2014-07-20 DIAGNOSIS — R0602 Shortness of breath: Secondary | ICD-10-CM | POA: Diagnosis not present

## 2014-07-20 DIAGNOSIS — M25559 Pain in unspecified hip: Secondary | ICD-10-CM | POA: Diagnosis not present

## 2014-07-20 DIAGNOSIS — M545 Low back pain: Secondary | ICD-10-CM | POA: Diagnosis not present

## 2014-07-20 DIAGNOSIS — M5430 Sciatica, unspecified side: Secondary | ICD-10-CM | POA: Diagnosis not present

## 2014-07-23 DIAGNOSIS — M545 Low back pain: Secondary | ICD-10-CM | POA: Diagnosis not present

## 2014-07-23 DIAGNOSIS — M4806 Spinal stenosis, lumbar region: Secondary | ICD-10-CM | POA: Diagnosis not present

## 2014-07-23 DIAGNOSIS — M5430 Sciatica, unspecified side: Secondary | ICD-10-CM | POA: Diagnosis not present

## 2014-07-23 DIAGNOSIS — M5135 Other intervertebral disc degeneration, thoracolumbar region: Secondary | ICD-10-CM | POA: Diagnosis not present

## 2014-07-24 DIAGNOSIS — L03116 Cellulitis of left lower limb: Secondary | ICD-10-CM | POA: Diagnosis not present

## 2014-07-26 DIAGNOSIS — I10 Essential (primary) hypertension: Secondary | ICD-10-CM | POA: Diagnosis not present

## 2014-07-26 DIAGNOSIS — R1012 Left upper quadrant pain: Secondary | ICD-10-CM | POA: Diagnosis not present

## 2014-07-26 DIAGNOSIS — L03116 Cellulitis of left lower limb: Secondary | ICD-10-CM | POA: Diagnosis not present

## 2014-07-26 DIAGNOSIS — R079 Chest pain, unspecified: Secondary | ICD-10-CM | POA: Diagnosis not present

## 2014-07-26 DIAGNOSIS — R918 Other nonspecific abnormal finding of lung field: Secondary | ICD-10-CM | POA: Diagnosis not present

## 2014-07-26 DIAGNOSIS — J9601 Acute respiratory failure with hypoxia: Secondary | ICD-10-CM | POA: Diagnosis not present

## 2014-07-26 DIAGNOSIS — L03115 Cellulitis of right lower limb: Secondary | ICD-10-CM | POA: Diagnosis not present

## 2014-07-26 DIAGNOSIS — N179 Acute kidney failure, unspecified: Secondary | ICD-10-CM | POA: Diagnosis not present

## 2014-07-26 DIAGNOSIS — J189 Pneumonia, unspecified organism: Secondary | ICD-10-CM | POA: Diagnosis not present

## 2014-07-26 DIAGNOSIS — K219 Gastro-esophageal reflux disease without esophagitis: Secondary | ICD-10-CM | POA: Diagnosis not present

## 2014-07-26 DIAGNOSIS — R600 Localized edema: Secondary | ICD-10-CM | POA: Diagnosis not present

## 2014-07-27 DIAGNOSIS — N179 Acute kidney failure, unspecified: Secondary | ICD-10-CM | POA: Diagnosis not present

## 2014-07-27 DIAGNOSIS — M5489 Other dorsalgia: Secondary | ICD-10-CM | POA: Diagnosis not present

## 2014-07-27 DIAGNOSIS — M5432 Sciatica, left side: Secondary | ICD-10-CM | POA: Diagnosis not present

## 2014-07-27 DIAGNOSIS — Z79899 Other long term (current) drug therapy: Secondary | ICD-10-CM | POA: Diagnosis not present

## 2014-07-27 DIAGNOSIS — J189 Pneumonia, unspecified organism: Secondary | ICD-10-CM | POA: Diagnosis not present

## 2014-07-27 DIAGNOSIS — G8929 Other chronic pain: Secondary | ICD-10-CM | POA: Diagnosis not present

## 2014-07-27 DIAGNOSIS — R600 Localized edema: Secondary | ICD-10-CM | POA: Diagnosis not present

## 2014-07-27 DIAGNOSIS — R1012 Left upper quadrant pain: Secondary | ICD-10-CM | POA: Diagnosis not present

## 2014-07-27 DIAGNOSIS — K219 Gastro-esophageal reflux disease without esophagitis: Secondary | ICD-10-CM | POA: Diagnosis not present

## 2014-07-27 DIAGNOSIS — R0602 Shortness of breath: Secondary | ICD-10-CM | POA: Diagnosis not present

## 2014-07-27 DIAGNOSIS — Z880 Allergy status to penicillin: Secondary | ICD-10-CM | POA: Diagnosis not present

## 2014-07-27 DIAGNOSIS — L600 Ingrowing nail: Secondary | ICD-10-CM | POA: Diagnosis present

## 2014-07-27 DIAGNOSIS — M5442 Lumbago with sciatica, left side: Secondary | ICD-10-CM | POA: Diagnosis present

## 2014-07-27 DIAGNOSIS — R1013 Epigastric pain: Secondary | ICD-10-CM | POA: Diagnosis not present

## 2014-07-27 DIAGNOSIS — N62 Hypertrophy of breast: Secondary | ICD-10-CM | POA: Diagnosis present

## 2014-07-27 DIAGNOSIS — L03115 Cellulitis of right lower limb: Secondary | ICD-10-CM | POA: Diagnosis present

## 2014-07-27 DIAGNOSIS — I10 Essential (primary) hypertension: Secondary | ICD-10-CM | POA: Diagnosis not present

## 2014-07-27 DIAGNOSIS — J9601 Acute respiratory failure with hypoxia: Secondary | ICD-10-CM | POA: Diagnosis not present

## 2014-07-31 DIAGNOSIS — M7989 Other specified soft tissue disorders: Secondary | ICD-10-CM | POA: Diagnosis not present

## 2014-07-31 DIAGNOSIS — M25471 Effusion, right ankle: Secondary | ICD-10-CM | POA: Diagnosis not present

## 2014-07-31 DIAGNOSIS — I10 Essential (primary) hypertension: Secondary | ICD-10-CM | POA: Diagnosis not present

## 2014-07-31 DIAGNOSIS — M25571 Pain in right ankle and joints of right foot: Secondary | ICD-10-CM | POA: Diagnosis not present

## 2014-07-31 DIAGNOSIS — Z872 Personal history of diseases of the skin and subcutaneous tissue: Secondary | ICD-10-CM | POA: Diagnosis not present

## 2014-08-16 DIAGNOSIS — E780 Pure hypercholesterolemia: Secondary | ICD-10-CM | POA: Diagnosis not present

## 2014-08-16 DIAGNOSIS — Z0000 Encounter for general adult medical examination without abnormal findings: Secondary | ICD-10-CM | POA: Diagnosis not present

## 2014-08-16 DIAGNOSIS — Z79899 Other long term (current) drug therapy: Secondary | ICD-10-CM | POA: Diagnosis not present

## 2014-08-16 DIAGNOSIS — E119 Type 2 diabetes mellitus without complications: Secondary | ICD-10-CM | POA: Diagnosis not present

## 2014-08-16 DIAGNOSIS — R5383 Other fatigue: Secondary | ICD-10-CM | POA: Diagnosis not present

## 2014-08-17 DIAGNOSIS — G8929 Other chronic pain: Secondary | ICD-10-CM | POA: Diagnosis not present

## 2014-08-17 DIAGNOSIS — Z79899 Other long term (current) drug therapy: Secondary | ICD-10-CM | POA: Diagnosis not present

## 2014-08-17 DIAGNOSIS — M5416 Radiculopathy, lumbar region: Secondary | ICD-10-CM | POA: Diagnosis not present

## 2014-08-28 DIAGNOSIS — E785 Hyperlipidemia, unspecified: Secondary | ICD-10-CM | POA: Diagnosis not present

## 2014-08-28 DIAGNOSIS — S0003XA Contusion of scalp, initial encounter: Secondary | ICD-10-CM | POA: Diagnosis not present

## 2014-08-28 DIAGNOSIS — Z79899 Other long term (current) drug therapy: Secondary | ICD-10-CM | POA: Diagnosis not present

## 2014-08-28 DIAGNOSIS — F419 Anxiety disorder, unspecified: Secondary | ICD-10-CM | POA: Diagnosis not present

## 2014-08-28 DIAGNOSIS — F329 Major depressive disorder, single episode, unspecified: Secondary | ICD-10-CM | POA: Diagnosis not present

## 2014-08-28 DIAGNOSIS — M542 Cervicalgia: Secondary | ICD-10-CM | POA: Diagnosis not present

## 2014-08-28 DIAGNOSIS — S0990XA Unspecified injury of head, initial encounter: Secondary | ICD-10-CM | POA: Diagnosis not present

## 2014-09-04 DIAGNOSIS — Z981 Arthrodesis status: Secondary | ICD-10-CM | POA: Diagnosis not present

## 2014-09-04 DIAGNOSIS — Z967 Presence of other bone and tendon implants: Secondary | ICD-10-CM | POA: Diagnosis not present

## 2014-09-04 DIAGNOSIS — Z880 Allergy status to penicillin: Secondary | ICD-10-CM | POA: Diagnosis not present

## 2014-09-04 DIAGNOSIS — R51 Headache: Secondary | ICD-10-CM | POA: Diagnosis not present

## 2014-09-04 DIAGNOSIS — Z4889 Encounter for other specified surgical aftercare: Secondary | ICD-10-CM | POA: Diagnosis not present

## 2014-09-04 DIAGNOSIS — Z888 Allergy status to other drugs, medicaments and biological substances status: Secondary | ICD-10-CM | POA: Diagnosis not present

## 2014-09-04 DIAGNOSIS — M542 Cervicalgia: Secondary | ICD-10-CM | POA: Diagnosis not present

## 2014-10-11 DIAGNOSIS — G8929 Other chronic pain: Secondary | ICD-10-CM | POA: Diagnosis not present

## 2014-10-11 DIAGNOSIS — Z7289 Other problems related to lifestyle: Secondary | ICD-10-CM | POA: Diagnosis not present

## 2014-10-11 DIAGNOSIS — Z880 Allergy status to penicillin: Secondary | ICD-10-CM | POA: Diagnosis not present

## 2014-10-11 DIAGNOSIS — M549 Dorsalgia, unspecified: Secondary | ICD-10-CM | POA: Diagnosis not present

## 2014-10-11 DIAGNOSIS — I10 Essential (primary) hypertension: Secondary | ICD-10-CM | POA: Diagnosis not present

## 2014-10-11 DIAGNOSIS — M545 Low back pain: Secondary | ICD-10-CM | POA: Diagnosis not present

## 2014-10-19 DIAGNOSIS — M5106 Intervertebral disc disorders with myelopathy, lumbar region: Secondary | ICD-10-CM | POA: Diagnosis not present

## 2014-10-19 DIAGNOSIS — M5416 Radiculopathy, lumbar region: Secondary | ICD-10-CM | POA: Diagnosis not present

## 2014-10-19 DIAGNOSIS — Z79899 Other long term (current) drug therapy: Secondary | ICD-10-CM | POA: Diagnosis not present

## 2014-10-19 DIAGNOSIS — M4726 Other spondylosis with radiculopathy, lumbar region: Secondary | ICD-10-CM | POA: Diagnosis not present

## 2014-10-19 DIAGNOSIS — G894 Chronic pain syndrome: Secondary | ICD-10-CM | POA: Diagnosis not present

## 2014-10-19 DIAGNOSIS — Z79891 Long term (current) use of opiate analgesic: Secondary | ICD-10-CM | POA: Diagnosis not present

## 2014-10-28 DIAGNOSIS — R600 Localized edema: Secondary | ICD-10-CM | POA: Diagnosis not present

## 2014-10-28 DIAGNOSIS — L03031 Cellulitis of right toe: Secondary | ICD-10-CM | POA: Diagnosis not present

## 2014-10-28 DIAGNOSIS — I10 Essential (primary) hypertension: Secondary | ICD-10-CM | POA: Diagnosis not present

## 2014-10-28 DIAGNOSIS — L03115 Cellulitis of right lower limb: Secondary | ICD-10-CM | POA: Diagnosis not present

## 2014-11-12 DIAGNOSIS — M5116 Intervertebral disc disorders with radiculopathy, lumbar region: Secondary | ICD-10-CM | POA: Diagnosis not present

## 2014-11-12 DIAGNOSIS — M961 Postlaminectomy syndrome, not elsewhere classified: Secondary | ICD-10-CM | POA: Diagnosis not present

## 2014-11-12 DIAGNOSIS — M5432 Sciatica, left side: Secondary | ICD-10-CM | POA: Diagnosis not present

## 2014-11-12 DIAGNOSIS — M4726 Other spondylosis with radiculopathy, lumbar region: Secondary | ICD-10-CM | POA: Diagnosis not present

## 2014-11-22 DIAGNOSIS — Z01818 Encounter for other preprocedural examination: Secondary | ICD-10-CM | POA: Diagnosis not present

## 2014-11-24 DIAGNOSIS — Z87891 Personal history of nicotine dependence: Secondary | ICD-10-CM | POA: Diagnosis not present

## 2014-11-24 DIAGNOSIS — M5116 Intervertebral disc disorders with radiculopathy, lumbar region: Secondary | ICD-10-CM | POA: Diagnosis not present

## 2014-11-24 DIAGNOSIS — M1990 Unspecified osteoarthritis, unspecified site: Secondary | ICD-10-CM | POA: Diagnosis not present

## 2014-11-24 DIAGNOSIS — M961 Postlaminectomy syndrome, not elsewhere classified: Secondary | ICD-10-CM | POA: Diagnosis not present

## 2014-11-24 DIAGNOSIS — M4806 Spinal stenosis, lumbar region: Secondary | ICD-10-CM | POA: Diagnosis not present

## 2014-11-24 DIAGNOSIS — Z9889 Other specified postprocedural states: Secondary | ICD-10-CM | POA: Diagnosis not present

## 2014-11-24 DIAGNOSIS — J45909 Unspecified asthma, uncomplicated: Secondary | ICD-10-CM | POA: Diagnosis not present

## 2014-11-24 DIAGNOSIS — Z79899 Other long term (current) drug therapy: Secondary | ICD-10-CM | POA: Diagnosis not present

## 2014-11-24 DIAGNOSIS — M4726 Other spondylosis with radiculopathy, lumbar region: Secondary | ICD-10-CM | POA: Diagnosis not present

## 2014-11-24 DIAGNOSIS — K449 Diaphragmatic hernia without obstruction or gangrene: Secondary | ICD-10-CM | POA: Diagnosis not present

## 2014-11-24 DIAGNOSIS — J449 Chronic obstructive pulmonary disease, unspecified: Secondary | ICD-10-CM | POA: Diagnosis not present

## 2014-11-24 DIAGNOSIS — Z79891 Long term (current) use of opiate analgesic: Secondary | ICD-10-CM | POA: Diagnosis not present

## 2014-11-24 DIAGNOSIS — I10 Essential (primary) hypertension: Secondary | ICD-10-CM | POA: Diagnosis not present

## 2014-11-25 DIAGNOSIS — M961 Postlaminectomy syndrome, not elsewhere classified: Secondary | ICD-10-CM | POA: Diagnosis not present

## 2014-11-25 DIAGNOSIS — M5116 Intervertebral disc disorders with radiculopathy, lumbar region: Secondary | ICD-10-CM | POA: Diagnosis not present

## 2014-11-25 DIAGNOSIS — I10 Essential (primary) hypertension: Secondary | ICD-10-CM | POA: Diagnosis not present

## 2014-11-25 DIAGNOSIS — Z79899 Other long term (current) drug therapy: Secondary | ICD-10-CM | POA: Diagnosis not present

## 2014-11-25 DIAGNOSIS — M4806 Spinal stenosis, lumbar region: Secondary | ICD-10-CM | POA: Diagnosis not present

## 2014-11-25 DIAGNOSIS — M4726 Other spondylosis with radiculopathy, lumbar region: Secondary | ICD-10-CM | POA: Diagnosis not present

## 2015-01-20 DIAGNOSIS — M5416 Radiculopathy, lumbar region: Secondary | ICD-10-CM | POA: Diagnosis not present

## 2015-01-20 DIAGNOSIS — Z79899 Other long term (current) drug therapy: Secondary | ICD-10-CM | POA: Diagnosis not present

## 2015-01-20 DIAGNOSIS — M5116 Intervertebral disc disorders with radiculopathy, lumbar region: Secondary | ICD-10-CM | POA: Diagnosis not present

## 2015-01-20 DIAGNOSIS — G894 Chronic pain syndrome: Secondary | ICD-10-CM | POA: Diagnosis not present

## 2015-01-20 DIAGNOSIS — M4726 Other spondylosis with radiculopathy, lumbar region: Secondary | ICD-10-CM | POA: Diagnosis not present

## 2015-01-20 DIAGNOSIS — M961 Postlaminectomy syndrome, not elsewhere classified: Secondary | ICD-10-CM | POA: Diagnosis not present

## 2015-02-17 DIAGNOSIS — M961 Postlaminectomy syndrome, not elsewhere classified: Secondary | ICD-10-CM | POA: Diagnosis not present

## 2015-02-17 DIAGNOSIS — M5416 Radiculopathy, lumbar region: Secondary | ICD-10-CM | POA: Diagnosis not present

## 2015-02-17 DIAGNOSIS — M5432 Sciatica, left side: Secondary | ICD-10-CM | POA: Diagnosis not present

## 2015-02-17 DIAGNOSIS — M5431 Sciatica, right side: Secondary | ICD-10-CM | POA: Diagnosis not present

## 2015-03-08 DIAGNOSIS — G890 Central pain syndrome: Secondary | ICD-10-CM | POA: Diagnosis not present

## 2015-03-08 DIAGNOSIS — M5442 Lumbago with sciatica, left side: Secondary | ICD-10-CM | POA: Diagnosis not present

## 2015-03-08 DIAGNOSIS — R202 Paresthesia of skin: Secondary | ICD-10-CM | POA: Diagnosis not present

## 2015-03-08 DIAGNOSIS — Z79891 Long term (current) use of opiate analgesic: Secondary | ICD-10-CM | POA: Diagnosis not present

## 2015-03-08 DIAGNOSIS — M545 Low back pain: Secondary | ICD-10-CM | POA: Diagnosis not present

## 2015-03-08 DIAGNOSIS — M5441 Lumbago with sciatica, right side: Secondary | ICD-10-CM | POA: Diagnosis not present

## 2015-03-08 DIAGNOSIS — M5431 Sciatica, right side: Secondary | ICD-10-CM | POA: Diagnosis not present

## 2015-03-08 DIAGNOSIS — G603 Idiopathic progressive neuropathy: Secondary | ICD-10-CM | POA: Diagnosis not present

## 2015-03-15 IMAGING — CR DG CHEST 2V
2 series · 2 of 2 positions shown · non-contrast
Comparison: None.

CLINICAL DATA: Shortness of breath

EXAM:
CHEST  2 VIEW

[w chest pa]
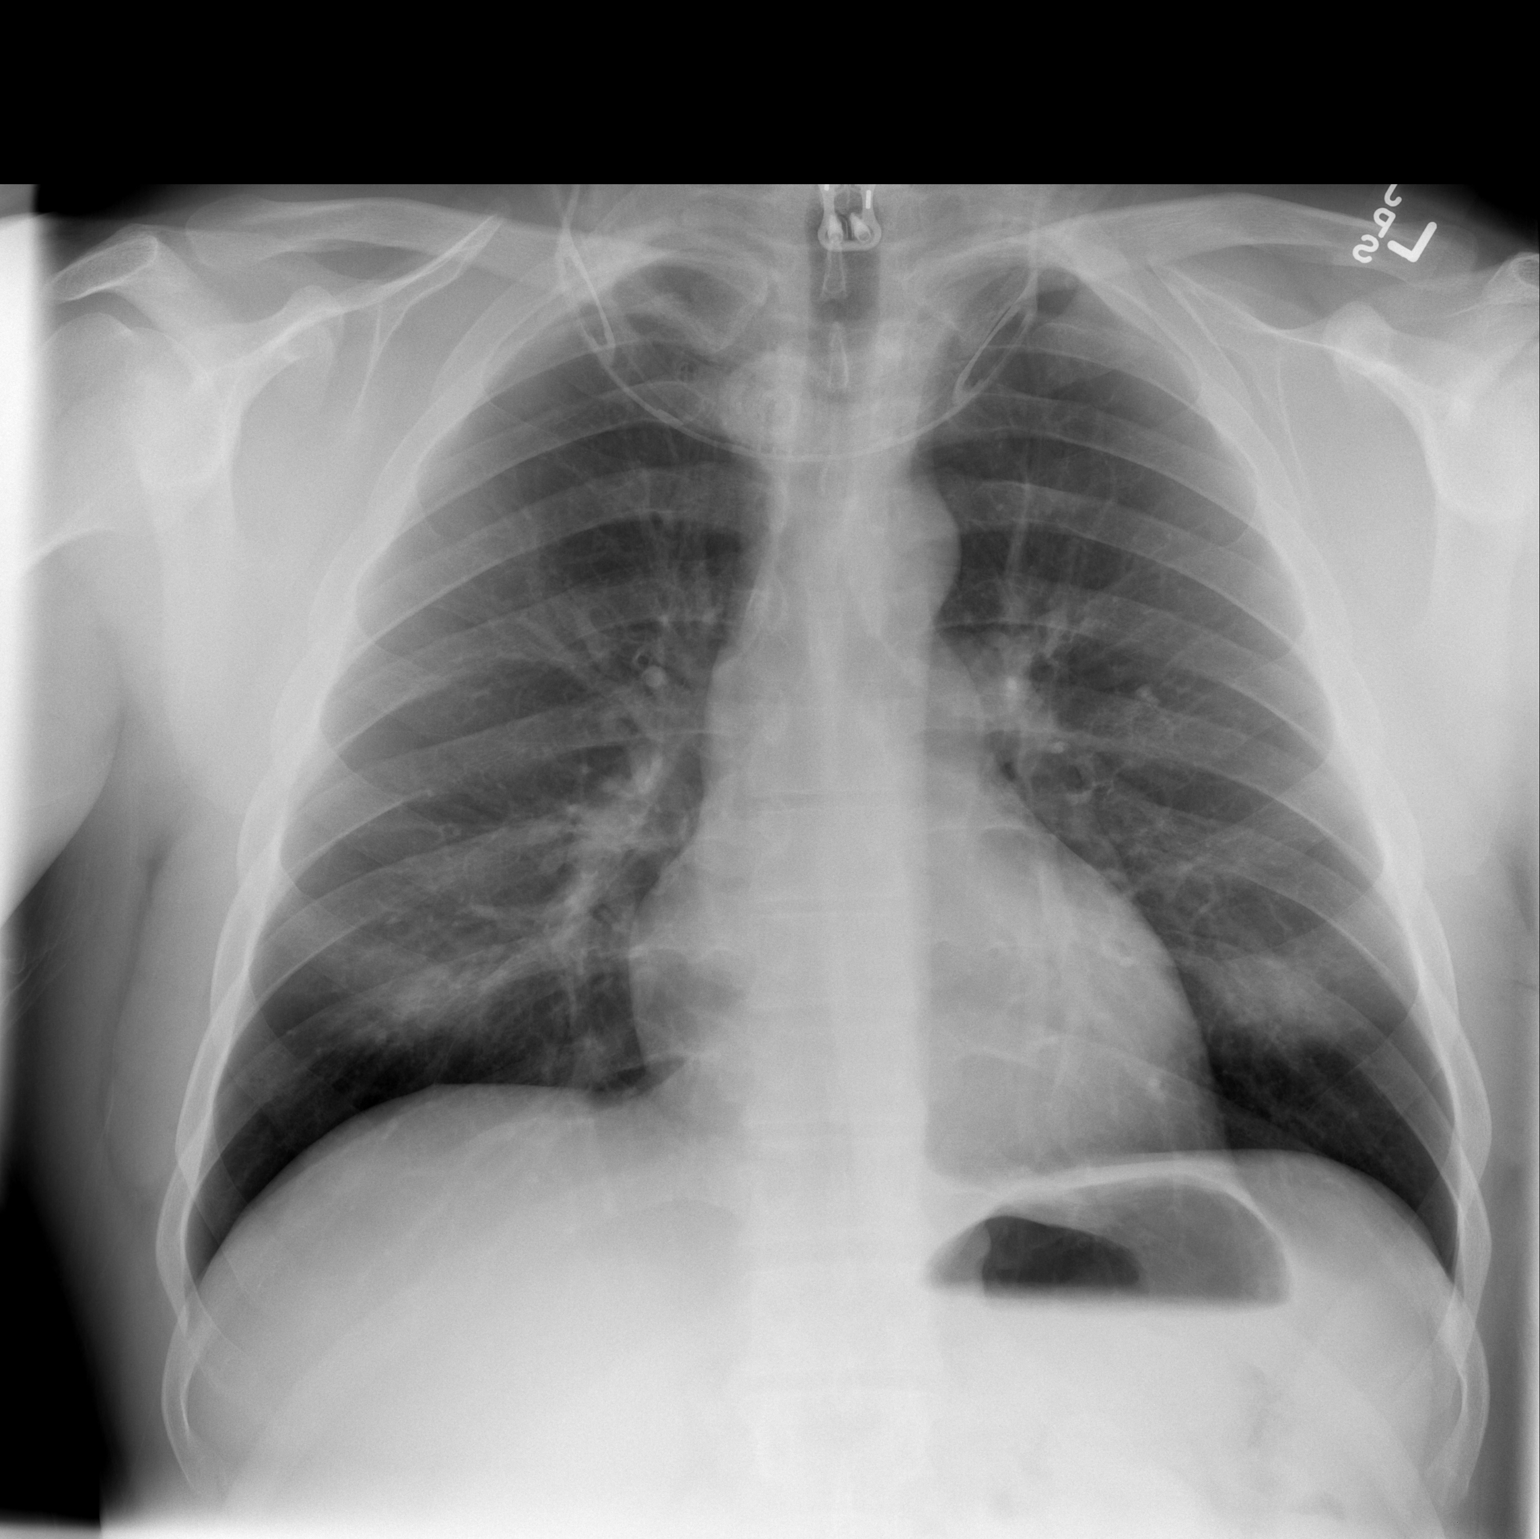

[w chest lat]
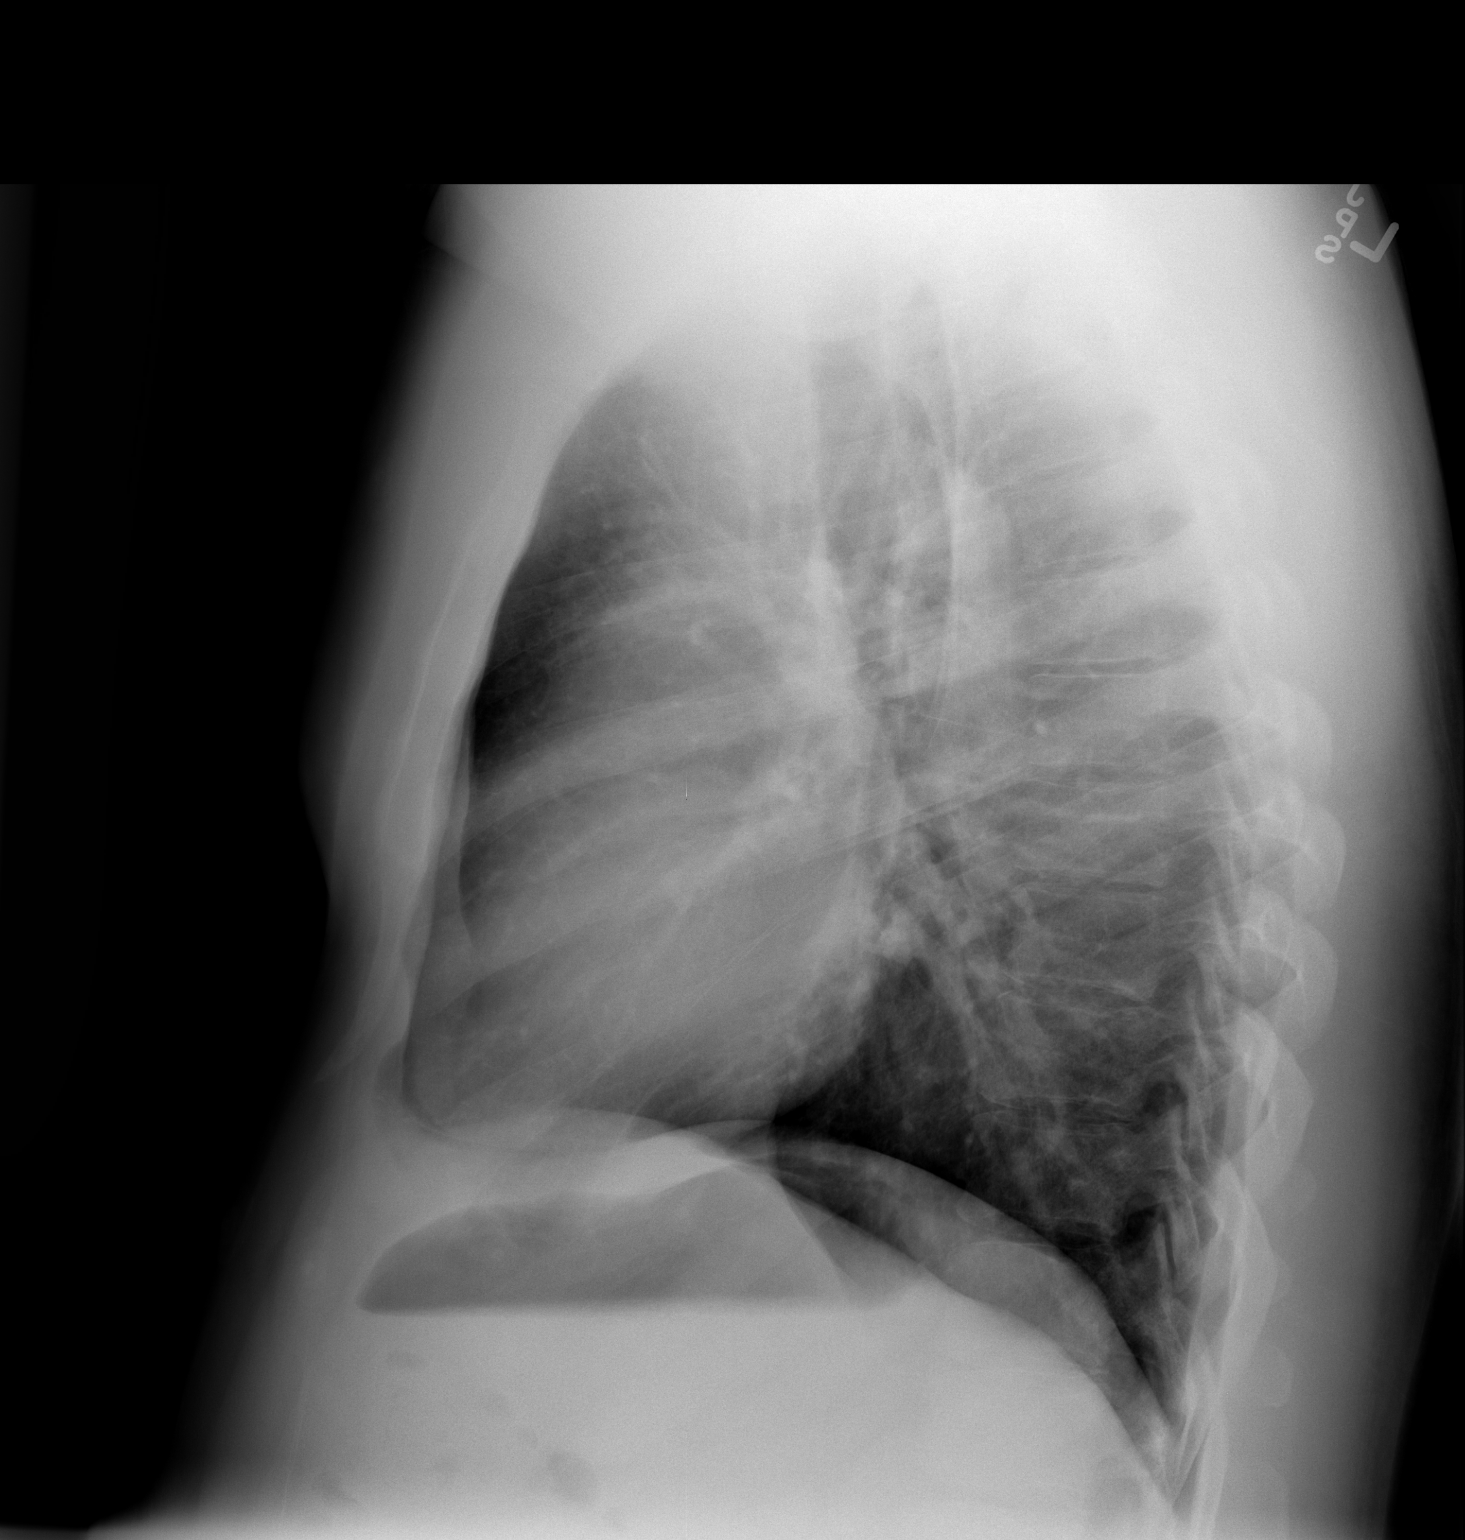

[2 of 2 positions shown; findings below may reference images not displayed]

FINDINGS: The heart size and mediastinal contours are within normal limits.
Both lungs are clear. The visualized skeletal structures are
unremarkable.
IMPRESSION: No active cardiopulmonary disease.

## 2015-04-01 DIAGNOSIS — G890 Central pain syndrome: Secondary | ICD-10-CM | POA: Diagnosis not present

## 2015-04-01 DIAGNOSIS — Z79891 Long term (current) use of opiate analgesic: Secondary | ICD-10-CM | POA: Diagnosis not present

## 2015-04-01 DIAGNOSIS — M545 Low back pain: Secondary | ICD-10-CM | POA: Diagnosis not present

## 2015-04-01 DIAGNOSIS — M25552 Pain in left hip: Secondary | ICD-10-CM | POA: Diagnosis not present

## 2015-04-01 DIAGNOSIS — M79662 Pain in left lower leg: Secondary | ICD-10-CM | POA: Diagnosis not present

## 2015-04-01 DIAGNOSIS — G8929 Other chronic pain: Secondary | ICD-10-CM | POA: Diagnosis not present

## 2015-04-29 DIAGNOSIS — M545 Low back pain: Secondary | ICD-10-CM | POA: Diagnosis not present

## 2015-04-29 DIAGNOSIS — M542 Cervicalgia: Secondary | ICD-10-CM | POA: Diagnosis not present

## 2015-04-29 DIAGNOSIS — G894 Chronic pain syndrome: Secondary | ICD-10-CM | POA: Diagnosis not present

## 2015-04-29 DIAGNOSIS — G890 Central pain syndrome: Secondary | ICD-10-CM | POA: Diagnosis not present

## 2015-04-29 DIAGNOSIS — M79605 Pain in left leg: Secondary | ICD-10-CM | POA: Diagnosis not present

## 2015-07-19 DIAGNOSIS — G8929 Other chronic pain: Secondary | ICD-10-CM | POA: Diagnosis not present

## 2015-07-19 DIAGNOSIS — I10 Essential (primary) hypertension: Secondary | ICD-10-CM | POA: Diagnosis present

## 2015-07-19 DIAGNOSIS — L03115 Cellulitis of right lower limb: Secondary | ICD-10-CM | POA: Diagnosis not present

## 2015-07-19 DIAGNOSIS — L03116 Cellulitis of left lower limb: Secondary | ICD-10-CM | POA: Diagnosis not present

## 2015-07-19 DIAGNOSIS — Z79891 Long term (current) use of opiate analgesic: Secondary | ICD-10-CM | POA: Diagnosis not present

## 2015-07-19 DIAGNOSIS — R21 Rash and other nonspecific skin eruption: Secondary | ICD-10-CM | POA: Diagnosis not present

## 2015-07-19 DIAGNOSIS — R0602 Shortness of breath: Secondary | ICD-10-CM | POA: Diagnosis not present

## 2015-07-19 DIAGNOSIS — K449 Diaphragmatic hernia without obstruction or gangrene: Secondary | ICD-10-CM | POA: Diagnosis present

## 2015-07-19 DIAGNOSIS — M549 Dorsalgia, unspecified: Secondary | ICD-10-CM | POA: Diagnosis present

## 2015-07-19 DIAGNOSIS — F419 Anxiety disorder, unspecified: Secondary | ICD-10-CM | POA: Diagnosis present

## 2015-07-19 DIAGNOSIS — R600 Localized edema: Secondary | ICD-10-CM | POA: Diagnosis not present

## 2015-07-19 DIAGNOSIS — L03119 Cellulitis of unspecified part of limb: Secondary | ICD-10-CM | POA: Diagnosis not present

## 2015-07-19 DIAGNOSIS — Z79899 Other long term (current) drug therapy: Secondary | ICD-10-CM | POA: Diagnosis not present

## 2015-07-19 DIAGNOSIS — Z880 Allergy status to penicillin: Secondary | ICD-10-CM | POA: Diagnosis not present

## 2015-07-19 DIAGNOSIS — Z981 Arthrodesis status: Secondary | ICD-10-CM | POA: Diagnosis not present

## 2015-07-19 DIAGNOSIS — Z87891 Personal history of nicotine dependence: Secondary | ICD-10-CM | POA: Diagnosis not present

## 2015-07-19 DIAGNOSIS — F4310 Post-traumatic stress disorder, unspecified: Secondary | ICD-10-CM | POA: Diagnosis not present

## 2015-07-19 DIAGNOSIS — J449 Chronic obstructive pulmonary disease, unspecified: Secondary | ICD-10-CM | POA: Diagnosis present

## 2015-07-27 DIAGNOSIS — R000 Tachycardia, unspecified: Secondary | ICD-10-CM | POA: Diagnosis not present

## 2015-07-27 DIAGNOSIS — R402432 Glasgow coma scale score 3-8, at arrival to emergency department: Secondary | ICD-10-CM | POA: Diagnosis not present

## 2015-07-27 DIAGNOSIS — R112 Nausea with vomiting, unspecified: Secondary | ICD-10-CM | POA: Diagnosis not present

## 2015-07-27 DIAGNOSIS — R197 Diarrhea, unspecified: Secondary | ICD-10-CM | POA: Diagnosis not present

## 2015-07-27 DIAGNOSIS — J90 Pleural effusion, not elsewhere classified: Secondary | ICD-10-CM | POA: Diagnosis not present

## 2015-07-27 DIAGNOSIS — Z79891 Long term (current) use of opiate analgesic: Secondary | ICD-10-CM | POA: Diagnosis not present

## 2015-07-27 DIAGNOSIS — K209 Esophagitis, unspecified: Secondary | ICD-10-CM | POA: Diagnosis not present

## 2015-07-27 DIAGNOSIS — I10 Essential (primary) hypertension: Secondary | ICD-10-CM | POA: Diagnosis present

## 2015-07-27 DIAGNOSIS — K208 Other esophagitis: Secondary | ICD-10-CM | POA: Diagnosis not present

## 2015-07-27 DIAGNOSIS — K921 Melena: Secondary | ICD-10-CM | POA: Diagnosis not present

## 2015-07-27 DIAGNOSIS — F419 Anxiety disorder, unspecified: Secondary | ICD-10-CM | POA: Diagnosis not present

## 2015-07-27 DIAGNOSIS — J9600 Acute respiratory failure, unspecified whether with hypoxia or hypercapnia: Secondary | ICD-10-CM | POA: Diagnosis not present

## 2015-07-27 DIAGNOSIS — R0602 Shortness of breath: Secondary | ICD-10-CM | POA: Diagnosis not present

## 2015-07-27 DIAGNOSIS — F4310 Post-traumatic stress disorder, unspecified: Secondary | ICD-10-CM | POA: Diagnosis present

## 2015-07-27 DIAGNOSIS — Z9989 Dependence on other enabling machines and devices: Secondary | ICD-10-CM | POA: Diagnosis not present

## 2015-07-27 DIAGNOSIS — J95821 Acute postprocedural respiratory failure: Secondary | ICD-10-CM | POA: Diagnosis not present

## 2015-07-27 DIAGNOSIS — D62 Acute posthemorrhagic anemia: Secondary | ICD-10-CM | POA: Diagnosis not present

## 2015-07-27 DIAGNOSIS — M545 Low back pain: Secondary | ICD-10-CM | POA: Diagnosis not present

## 2015-07-27 DIAGNOSIS — K219 Gastro-esophageal reflux disease without esophagitis: Secondary | ICD-10-CM | POA: Diagnosis present

## 2015-07-27 DIAGNOSIS — K2210 Ulcer of esophagus without bleeding: Secondary | ICD-10-CM | POA: Diagnosis not present

## 2015-07-27 DIAGNOSIS — K226 Gastro-esophageal laceration-hemorrhage syndrome: Secondary | ICD-10-CM | POA: Diagnosis not present

## 2015-07-27 DIAGNOSIS — I959 Hypotension, unspecified: Secondary | ICD-10-CM | POA: Diagnosis not present

## 2015-07-27 DIAGNOSIS — R109 Unspecified abdominal pain: Secondary | ICD-10-CM | POA: Diagnosis not present

## 2015-07-27 DIAGNOSIS — M549 Dorsalgia, unspecified: Secondary | ICD-10-CM | POA: Diagnosis present

## 2015-07-27 DIAGNOSIS — G8918 Other acute postprocedural pain: Secondary | ICD-10-CM | POA: Diagnosis not present

## 2015-07-27 DIAGNOSIS — E872 Acidosis: Secondary | ICD-10-CM | POA: Diagnosis not present

## 2015-07-27 DIAGNOSIS — R1084 Generalized abdominal pain: Secondary | ICD-10-CM | POA: Diagnosis not present

## 2015-07-27 DIAGNOSIS — E869 Volume depletion, unspecified: Secondary | ICD-10-CM | POA: Diagnosis not present

## 2015-07-27 DIAGNOSIS — J982 Interstitial emphysema: Secondary | ICD-10-CM | POA: Diagnosis not present

## 2015-07-27 DIAGNOSIS — K228 Other specified diseases of esophagus: Secondary | ICD-10-CM | POA: Diagnosis not present

## 2015-07-27 DIAGNOSIS — G8929 Other chronic pain: Secondary | ICD-10-CM | POA: Diagnosis not present

## 2015-07-27 DIAGNOSIS — R1013 Epigastric pain: Secondary | ICD-10-CM | POA: Diagnosis not present

## 2015-07-27 DIAGNOSIS — K920 Hematemesis: Secondary | ICD-10-CM | POA: Diagnosis not present

## 2015-07-27 DIAGNOSIS — R1033 Periumbilical pain: Secondary | ICD-10-CM | POA: Diagnosis not present

## 2015-07-27 DIAGNOSIS — Z9911 Dependence on respirator [ventilator] status: Secondary | ICD-10-CM | POA: Diagnosis not present

## 2015-07-27 DIAGNOSIS — L03116 Cellulitis of left lower limb: Secondary | ICD-10-CM | POA: Diagnosis not present

## 2015-07-27 DIAGNOSIS — K922 Gastrointestinal hemorrhage, unspecified: Secondary | ICD-10-CM | POA: Diagnosis not present

## 2016-05-03 DIAGNOSIS — Z1389 Encounter for screening for other disorder: Secondary | ICD-10-CM | POA: Diagnosis not present

## 2016-05-03 DIAGNOSIS — F419 Anxiety disorder, unspecified: Secondary | ICD-10-CM | POA: Diagnosis not present

## 2016-05-03 DIAGNOSIS — I10 Essential (primary) hypertension: Secondary | ICD-10-CM | POA: Diagnosis not present

## 2016-05-03 DIAGNOSIS — Z6832 Body mass index (BMI) 32.0-32.9, adult: Secondary | ICD-10-CM | POA: Diagnosis not present

## 2016-05-03 DIAGNOSIS — E669 Obesity, unspecified: Secondary | ICD-10-CM | POA: Diagnosis not present

## 2016-05-03 DIAGNOSIS — K922 Gastrointestinal hemorrhage, unspecified: Secondary | ICD-10-CM | POA: Diagnosis not present

## 2016-05-03 DIAGNOSIS — G4700 Insomnia, unspecified: Secondary | ICD-10-CM | POA: Diagnosis not present

## 2016-05-07 DIAGNOSIS — K226 Gastro-esophageal laceration-hemorrhage syndrome: Secondary | ICD-10-CM | POA: Diagnosis not present

## 2016-05-07 DIAGNOSIS — K219 Gastro-esophageal reflux disease without esophagitis: Secondary | ICD-10-CM | POA: Diagnosis not present

## 2016-05-07 DIAGNOSIS — R1013 Epigastric pain: Secondary | ICD-10-CM | POA: Diagnosis not present

## 2016-05-07 DIAGNOSIS — R1310 Dysphagia, unspecified: Secondary | ICD-10-CM | POA: Diagnosis not present

## 2016-05-11 DIAGNOSIS — R1013 Epigastric pain: Secondary | ICD-10-CM | POA: Diagnosis not present

## 2016-05-11 DIAGNOSIS — R1310 Dysphagia, unspecified: Secondary | ICD-10-CM | POA: Diagnosis not present

## 2016-05-11 DIAGNOSIS — K319 Disease of stomach and duodenum, unspecified: Secondary | ICD-10-CM | POA: Diagnosis not present

## 2016-05-11 DIAGNOSIS — K3189 Other diseases of stomach and duodenum: Secondary | ICD-10-CM | POA: Diagnosis not present

## 2016-05-17 DIAGNOSIS — J984 Other disorders of lung: Secondary | ICD-10-CM | POA: Diagnosis not present

## 2016-05-17 DIAGNOSIS — G8911 Acute pain due to trauma: Secondary | ICD-10-CM | POA: Diagnosis not present

## 2016-05-17 DIAGNOSIS — S92331A Displaced fracture of third metatarsal bone, right foot, initial encounter for closed fracture: Secondary | ICD-10-CM | POA: Diagnosis present

## 2016-05-17 DIAGNOSIS — D72829 Elevated white blood cell count, unspecified: Secondary | ICD-10-CM | POA: Diagnosis present

## 2016-05-17 DIAGNOSIS — M7989 Other specified soft tissue disorders: Secondary | ICD-10-CM | POA: Diagnosis not present

## 2016-05-17 DIAGNOSIS — S22038A Other fracture of third thoracic vertebra, initial encounter for closed fracture: Secondary | ICD-10-CM | POA: Diagnosis not present

## 2016-05-17 DIAGNOSIS — D62 Acute posthemorrhagic anemia: Secondary | ICD-10-CM | POA: Diagnosis not present

## 2016-05-17 DIAGNOSIS — S1093XA Contusion of unspecified part of neck, initial encounter: Secondary | ICD-10-CM | POA: Diagnosis not present

## 2016-05-17 DIAGNOSIS — S22048A Other fracture of fourth thoracic vertebra, initial encounter for closed fracture: Secondary | ICD-10-CM | POA: Diagnosis not present

## 2016-05-17 DIAGNOSIS — S22068A Other fracture of T7-T8 thoracic vertebra, initial encounter for closed fracture: Secondary | ICD-10-CM | POA: Diagnosis not present

## 2016-05-17 DIAGNOSIS — Z041 Encounter for examination and observation following transport accident: Secondary | ICD-10-CM | POA: Diagnosis not present

## 2016-05-17 DIAGNOSIS — M19072 Primary osteoarthritis, left ankle and foot: Secondary | ICD-10-CM | POA: Diagnosis not present

## 2016-05-17 DIAGNOSIS — S92222A Displaced fracture of lateral cuneiform of left foot, initial encounter for closed fracture: Secondary | ICD-10-CM | POA: Diagnosis not present

## 2016-05-17 DIAGNOSIS — R739 Hyperglycemia, unspecified: Secondary | ICD-10-CM | POA: Diagnosis not present

## 2016-05-17 DIAGNOSIS — S22059A Unspecified fracture of T5-T6 vertebra, initial encounter for closed fracture: Secondary | ICD-10-CM | POA: Diagnosis not present

## 2016-05-17 DIAGNOSIS — S40922A Unspecified superficial injury of left upper arm, initial encounter: Secondary | ICD-10-CM | POA: Diagnosis not present

## 2016-05-17 DIAGNOSIS — S06360A Traumatic hemorrhage of cerebrum, unspecified, without loss of consciousness, initial encounter: Secondary | ICD-10-CM | POA: Diagnosis not present

## 2016-05-17 DIAGNOSIS — I10 Essential (primary) hypertension: Secondary | ICD-10-CM | POA: Diagnosis present

## 2016-05-17 DIAGNOSIS — F1911 Other psychoactive substance abuse, in remission: Secondary | ICD-10-CM | POA: Diagnosis present

## 2016-05-17 DIAGNOSIS — I619 Nontraumatic intracerebral hemorrhage, unspecified: Secondary | ICD-10-CM | POA: Diagnosis not present

## 2016-05-17 DIAGNOSIS — M549 Dorsalgia, unspecified: Secondary | ICD-10-CM | POA: Diagnosis not present

## 2016-05-17 DIAGNOSIS — R4182 Altered mental status, unspecified: Secondary | ICD-10-CM | POA: Diagnosis not present

## 2016-05-17 DIAGNOSIS — I808 Phlebitis and thrombophlebitis of other sites: Secondary | ICD-10-CM | POA: Diagnosis not present

## 2016-05-17 DIAGNOSIS — R740 Nonspecific elevation of levels of transaminase and lactic acid dehydrogenase [LDH]: Secondary | ICD-10-CM | POA: Diagnosis not present

## 2016-05-17 DIAGNOSIS — S299XXA Unspecified injury of thorax, initial encounter: Secondary | ICD-10-CM | POA: Diagnosis not present

## 2016-05-17 DIAGNOSIS — M25472 Effusion, left ankle: Secondary | ICD-10-CM | POA: Diagnosis not present

## 2016-05-17 DIAGNOSIS — R000 Tachycardia, unspecified: Secondary | ICD-10-CM | POA: Diagnosis not present

## 2016-05-17 DIAGNOSIS — F17210 Nicotine dependence, cigarettes, uncomplicated: Secondary | ICD-10-CM | POA: Diagnosis present

## 2016-05-17 DIAGNOSIS — M5126 Other intervertebral disc displacement, lumbar region: Secondary | ICD-10-CM | POA: Diagnosis present

## 2016-05-17 DIAGNOSIS — S12690A Other displaced fracture of seventh cervical vertebra, initial encounter for closed fracture: Secondary | ICD-10-CM | POA: Diagnosis not present

## 2016-05-17 DIAGNOSIS — S92002A Unspecified fracture of left calcaneus, initial encounter for closed fracture: Secondary | ICD-10-CM | POA: Diagnosis not present

## 2016-05-17 DIAGNOSIS — M79622 Pain in left upper arm: Secondary | ICD-10-CM | POA: Diagnosis not present

## 2016-05-17 DIAGNOSIS — I959 Hypotension, unspecified: Secondary | ICD-10-CM | POA: Diagnosis not present

## 2016-05-17 DIAGNOSIS — R2242 Localized swelling, mass and lump, left lower limb: Secondary | ICD-10-CM | POA: Diagnosis not present

## 2016-05-17 DIAGNOSIS — M5382 Other specified dorsopathies, cervical region: Secondary | ICD-10-CM | POA: Diagnosis not present

## 2016-05-17 DIAGNOSIS — M79672 Pain in left foot: Secondary | ICD-10-CM | POA: Diagnosis not present

## 2016-05-17 DIAGNOSIS — R0989 Other specified symptoms and signs involving the circulatory and respiratory systems: Secondary | ICD-10-CM | POA: Diagnosis not present

## 2016-05-17 DIAGNOSIS — S92332A Displaced fracture of third metatarsal bone, left foot, initial encounter for closed fracture: Secondary | ICD-10-CM | POA: Diagnosis not present

## 2016-05-17 DIAGNOSIS — S22019A Unspecified fracture of first thoracic vertebra, initial encounter for closed fracture: Secondary | ICD-10-CM | POA: Diagnosis not present

## 2016-05-17 DIAGNOSIS — S27321A Contusion of lung, unilateral, initial encounter: Secondary | ICD-10-CM | POA: Diagnosis not present

## 2016-05-17 DIAGNOSIS — S41112A Laceration without foreign body of left upper arm, initial encounter: Secondary | ICD-10-CM | POA: Diagnosis not present

## 2016-05-17 DIAGNOSIS — Q6682 Congenital vertical talus deformity, left foot: Secondary | ICD-10-CM | POA: Diagnosis not present

## 2016-05-17 DIAGNOSIS — S22058A Other fracture of T5-T6 vertebra, initial encounter for closed fracture: Secondary | ICD-10-CM | POA: Diagnosis not present

## 2016-05-17 DIAGNOSIS — S92902A Unspecified fracture of left foot, initial encounter for closed fracture: Secondary | ICD-10-CM | POA: Diagnosis not present

## 2016-05-17 DIAGNOSIS — S92252A Displaced fracture of navicular [scaphoid] of left foot, initial encounter for closed fracture: Secondary | ICD-10-CM | POA: Diagnosis not present

## 2016-05-17 DIAGNOSIS — S06369A Traumatic hemorrhage of cerebrum, unspecified, with loss of consciousness of unspecified duration, initial encounter: Secondary | ICD-10-CM | POA: Diagnosis not present

## 2016-05-17 DIAGNOSIS — K219 Gastro-esophageal reflux disease without esophagitis: Secondary | ICD-10-CM | POA: Diagnosis present

## 2016-05-17 DIAGNOSIS — Z981 Arthrodesis status: Secondary | ICD-10-CM | POA: Diagnosis not present

## 2016-05-17 DIAGNOSIS — S22039A Unspecified fracture of third thoracic vertebra, initial encounter for closed fracture: Secondary | ICD-10-CM | POA: Diagnosis not present

## 2016-05-17 DIAGNOSIS — S92341A Displaced fracture of fourth metatarsal bone, right foot, initial encounter for closed fracture: Secondary | ICD-10-CM | POA: Diagnosis present

## 2016-05-17 DIAGNOSIS — S2242XA Multiple fractures of ribs, left side, initial encounter for closed fracture: Secondary | ICD-10-CM | POA: Diagnosis not present

## 2016-05-17 DIAGNOSIS — R110 Nausea: Secondary | ICD-10-CM | POA: Diagnosis not present

## 2016-05-17 DIAGNOSIS — S41102A Unspecified open wound of left upper arm, initial encounter: Secondary | ICD-10-CM | POA: Diagnosis not present

## 2016-05-17 DIAGNOSIS — S062X9A Diffuse traumatic brain injury with loss of consciousness of unspecified duration, initial encounter: Secondary | ICD-10-CM | POA: Diagnosis not present

## 2016-05-17 DIAGNOSIS — S12600A Unspecified displaced fracture of seventh cervical vertebra, initial encounter for closed fracture: Secondary | ICD-10-CM | POA: Diagnosis not present

## 2016-05-17 DIAGNOSIS — S27322A Contusion of lung, bilateral, initial encounter: Secondary | ICD-10-CM | POA: Diagnosis not present

## 2016-05-17 DIAGNOSIS — M79602 Pain in left arm: Secondary | ICD-10-CM | POA: Diagnosis not present

## 2016-05-17 DIAGNOSIS — S22029A Unspecified fracture of second thoracic vertebra, initial encounter for closed fracture: Secondary | ICD-10-CM | POA: Diagnosis not present

## 2016-05-17 DIAGNOSIS — M25572 Pain in left ankle and joints of left foot: Secondary | ICD-10-CM | POA: Diagnosis not present

## 2016-05-17 DIAGNOSIS — S92022A Displaced fracture of anterior process of left calcaneus, initial encounter for closed fracture: Secondary | ICD-10-CM | POA: Diagnosis not present

## 2016-05-17 DIAGNOSIS — S22049A Unspecified fracture of fourth thoracic vertebra, initial encounter for closed fracture: Secondary | ICD-10-CM | POA: Diagnosis not present

## 2016-05-17 DIAGNOSIS — G8918 Other acute postprocedural pain: Secondary | ICD-10-CM | POA: Diagnosis not present

## 2016-05-17 DIAGNOSIS — S2249XA Multiple fractures of ribs, unspecified side, initial encounter for closed fracture: Secondary | ICD-10-CM | POA: Diagnosis not present

## 2016-05-17 DIAGNOSIS — R918 Other nonspecific abnormal finding of lung field: Secondary | ICD-10-CM | POA: Diagnosis not present

## 2016-05-30 DIAGNOSIS — S92344D Nondisplaced fracture of fourth metatarsal bone, right foot, subsequent encounter for fracture with routine healing: Secondary | ICD-10-CM | POA: Diagnosis not present

## 2016-05-30 DIAGNOSIS — Z9181 History of falling: Secondary | ICD-10-CM | POA: Diagnosis not present

## 2016-05-30 DIAGNOSIS — S2242XD Multiple fractures of ribs, left side, subsequent encounter for fracture with routine healing: Secondary | ICD-10-CM | POA: Diagnosis not present

## 2016-05-30 DIAGNOSIS — F419 Anxiety disorder, unspecified: Secondary | ICD-10-CM | POA: Diagnosis not present

## 2016-05-30 DIAGNOSIS — S22009D Unspecified fracture of unspecified thoracic vertebra, subsequent encounter for fracture with routine healing: Secondary | ICD-10-CM | POA: Diagnosis not present

## 2016-05-30 DIAGNOSIS — S92252D Displaced fracture of navicular [scaphoid] of left foot, subsequent encounter for fracture with routine healing: Secondary | ICD-10-CM | POA: Diagnosis not present

## 2016-05-30 DIAGNOSIS — S41102D Unspecified open wound of left upper arm, subsequent encounter: Secondary | ICD-10-CM | POA: Diagnosis not present

## 2016-05-30 DIAGNOSIS — I10 Essential (primary) hypertension: Secondary | ICD-10-CM | POA: Diagnosis not present

## 2016-05-30 DIAGNOSIS — S06360D Traumatic hemorrhage of cerebrum, unspecified, without loss of consciousness, subsequent encounter: Secondary | ICD-10-CM | POA: Diagnosis not present

## 2016-05-30 DIAGNOSIS — F329 Major depressive disorder, single episode, unspecified: Secondary | ICD-10-CM | POA: Diagnosis not present

## 2016-05-30 DIAGNOSIS — S92302D Fracture of unspecified metatarsal bone(s), left foot, subsequent encounter for fracture with routine healing: Secondary | ICD-10-CM | POA: Diagnosis not present

## 2016-05-30 DIAGNOSIS — F17210 Nicotine dependence, cigarettes, uncomplicated: Secondary | ICD-10-CM | POA: Diagnosis not present

## 2016-06-01 DIAGNOSIS — S41102D Unspecified open wound of left upper arm, subsequent encounter: Secondary | ICD-10-CM | POA: Diagnosis not present

## 2016-06-01 DIAGNOSIS — S22009D Unspecified fracture of unspecified thoracic vertebra, subsequent encounter for fracture with routine healing: Secondary | ICD-10-CM | POA: Diagnosis not present

## 2016-06-01 DIAGNOSIS — S92252D Displaced fracture of navicular [scaphoid] of left foot, subsequent encounter for fracture with routine healing: Secondary | ICD-10-CM | POA: Diagnosis not present

## 2016-06-01 DIAGNOSIS — S2242XD Multiple fractures of ribs, left side, subsequent encounter for fracture with routine healing: Secondary | ICD-10-CM | POA: Diagnosis not present

## 2016-06-01 DIAGNOSIS — S06360D Traumatic hemorrhage of cerebrum, unspecified, without loss of consciousness, subsequent encounter: Secondary | ICD-10-CM | POA: Diagnosis not present

## 2016-06-01 DIAGNOSIS — S92344D Nondisplaced fracture of fourth metatarsal bone, right foot, subsequent encounter for fracture with routine healing: Secondary | ICD-10-CM | POA: Diagnosis not present

## 2016-06-04 DIAGNOSIS — I10 Essential (primary) hypertension: Secondary | ICD-10-CM | POA: Diagnosis not present

## 2016-06-04 DIAGNOSIS — Z6834 Body mass index (BMI) 34.0-34.9, adult: Secondary | ICD-10-CM | POA: Diagnosis not present

## 2016-06-04 DIAGNOSIS — F419 Anxiety disorder, unspecified: Secondary | ICD-10-CM | POA: Diagnosis not present

## 2016-06-04 DIAGNOSIS — G4700 Insomnia, unspecified: Secondary | ICD-10-CM | POA: Diagnosis not present

## 2016-06-04 DIAGNOSIS — D649 Anemia, unspecified: Secondary | ICD-10-CM | POA: Diagnosis not present

## 2016-06-05 DIAGNOSIS — S22001D Stable burst fracture of unspecified thoracic vertebra, subsequent encounter for fracture with routine healing: Secondary | ICD-10-CM | POA: Diagnosis not present

## 2016-06-05 DIAGNOSIS — Z885 Allergy status to narcotic agent status: Secondary | ICD-10-CM | POA: Diagnosis not present

## 2016-06-05 DIAGNOSIS — Z981 Arthrodesis status: Secondary | ICD-10-CM | POA: Diagnosis not present

## 2016-06-05 DIAGNOSIS — F17210 Nicotine dependence, cigarettes, uncomplicated: Secondary | ICD-10-CM | POA: Diagnosis not present

## 2016-06-05 DIAGNOSIS — I10 Essential (primary) hypertension: Secondary | ICD-10-CM | POA: Diagnosis not present

## 2016-06-05 DIAGNOSIS — Z886 Allergy status to analgesic agent status: Secondary | ICD-10-CM | POA: Diagnosis not present

## 2016-06-05 DIAGNOSIS — S22008D Other fracture of unspecified thoracic vertebra, subsequent encounter for fracture with routine healing: Secondary | ICD-10-CM | POA: Diagnosis not present

## 2016-06-05 DIAGNOSIS — S22009D Unspecified fracture of unspecified thoracic vertebra, subsequent encounter for fracture with routine healing: Secondary | ICD-10-CM | POA: Diagnosis not present

## 2016-06-05 DIAGNOSIS — Z888 Allergy status to other drugs, medicaments and biological substances status: Secondary | ICD-10-CM | POA: Diagnosis not present

## 2016-06-05 DIAGNOSIS — S12650D Other traumatic displaced spondylolisthesis of seventh cervical vertebra, subsequent encounter for fracture with routine healing: Secondary | ICD-10-CM | POA: Diagnosis not present

## 2016-06-05 DIAGNOSIS — Z880 Allergy status to penicillin: Secondary | ICD-10-CM | POA: Diagnosis not present

## 2016-06-05 DIAGNOSIS — Z7982 Long term (current) use of aspirin: Secondary | ICD-10-CM | POA: Diagnosis not present

## 2016-06-05 DIAGNOSIS — Z9181 History of falling: Secondary | ICD-10-CM | POA: Diagnosis not present

## 2016-06-05 DIAGNOSIS — Z79899 Other long term (current) drug therapy: Secondary | ICD-10-CM | POA: Diagnosis not present

## 2016-06-05 DIAGNOSIS — S12601D Unspecified nondisplaced fracture of seventh cervical vertebra, subsequent encounter for fracture with routine healing: Secondary | ICD-10-CM | POA: Diagnosis not present

## 2016-06-08 DIAGNOSIS — S2242XD Multiple fractures of ribs, left side, subsequent encounter for fracture with routine healing: Secondary | ICD-10-CM | POA: Diagnosis not present

## 2016-06-08 DIAGNOSIS — S06360D Traumatic hemorrhage of cerebrum, unspecified, without loss of consciousness, subsequent encounter: Secondary | ICD-10-CM | POA: Diagnosis not present

## 2016-06-08 DIAGNOSIS — S41102D Unspecified open wound of left upper arm, subsequent encounter: Secondary | ICD-10-CM | POA: Diagnosis not present

## 2016-06-08 DIAGNOSIS — S92252D Displaced fracture of navicular [scaphoid] of left foot, subsequent encounter for fracture with routine healing: Secondary | ICD-10-CM | POA: Diagnosis not present

## 2016-06-08 DIAGNOSIS — S92344D Nondisplaced fracture of fourth metatarsal bone, right foot, subsequent encounter for fracture with routine healing: Secondary | ICD-10-CM | POA: Diagnosis not present

## 2016-06-08 DIAGNOSIS — S22009D Unspecified fracture of unspecified thoracic vertebra, subsequent encounter for fracture with routine healing: Secondary | ICD-10-CM | POA: Diagnosis not present

## 2016-06-12 DIAGNOSIS — G8911 Acute pain due to trauma: Secondary | ICD-10-CM | POA: Diagnosis not present

## 2016-06-12 DIAGNOSIS — S92344D Nondisplaced fracture of fourth metatarsal bone, right foot, subsequent encounter for fracture with routine healing: Secondary | ICD-10-CM | POA: Diagnosis not present

## 2016-06-12 DIAGNOSIS — S06360D Traumatic hemorrhage of cerebrum, unspecified, without loss of consciousness, subsequent encounter: Secondary | ICD-10-CM | POA: Diagnosis not present

## 2016-06-12 DIAGNOSIS — S41002D Unspecified open wound of left shoulder, subsequent encounter: Secondary | ICD-10-CM | POA: Diagnosis not present

## 2016-06-12 DIAGNOSIS — Z4789 Encounter for other orthopedic aftercare: Secondary | ICD-10-CM | POA: Diagnosis not present

## 2016-06-12 DIAGNOSIS — S92334D Nondisplaced fracture of third metatarsal bone, right foot, subsequent encounter for fracture with routine healing: Secondary | ICD-10-CM | POA: Diagnosis not present

## 2016-06-12 DIAGNOSIS — Z888 Allergy status to other drugs, medicaments and biological substances status: Secondary | ICD-10-CM | POA: Diagnosis not present

## 2016-06-12 DIAGNOSIS — Z7982 Long term (current) use of aspirin: Secondary | ICD-10-CM | POA: Diagnosis not present

## 2016-06-12 DIAGNOSIS — Z9889 Other specified postprocedural states: Secondary | ICD-10-CM | POA: Diagnosis not present

## 2016-06-12 DIAGNOSIS — Z79899 Other long term (current) drug therapy: Secondary | ICD-10-CM | POA: Diagnosis not present

## 2016-06-12 DIAGNOSIS — Z885 Allergy status to narcotic agent status: Secondary | ICD-10-CM | POA: Diagnosis not present

## 2016-06-12 DIAGNOSIS — S92252D Displaced fracture of navicular [scaphoid] of left foot, subsequent encounter for fracture with routine healing: Secondary | ICD-10-CM | POA: Diagnosis not present

## 2016-06-12 DIAGNOSIS — Z880 Allergy status to penicillin: Secondary | ICD-10-CM | POA: Diagnosis not present

## 2016-06-12 DIAGNOSIS — S41102D Unspecified open wound of left upper arm, subsequent encounter: Secondary | ICD-10-CM | POA: Diagnosis not present

## 2016-06-12 DIAGNOSIS — S2242XD Multiple fractures of ribs, left side, subsequent encounter for fracture with routine healing: Secondary | ICD-10-CM | POA: Diagnosis not present

## 2016-06-12 DIAGNOSIS — S22009D Unspecified fracture of unspecified thoracic vertebra, subsequent encounter for fracture with routine healing: Secondary | ICD-10-CM | POA: Diagnosis not present

## 2016-06-12 DIAGNOSIS — Z886 Allergy status to analgesic agent status: Secondary | ICD-10-CM | POA: Diagnosis not present

## 2016-06-14 DIAGNOSIS — S92344D Nondisplaced fracture of fourth metatarsal bone, right foot, subsequent encounter for fracture with routine healing: Secondary | ICD-10-CM | POA: Diagnosis not present

## 2016-06-14 DIAGNOSIS — S22009D Unspecified fracture of unspecified thoracic vertebra, subsequent encounter for fracture with routine healing: Secondary | ICD-10-CM | POA: Diagnosis not present

## 2016-06-14 DIAGNOSIS — S2242XD Multiple fractures of ribs, left side, subsequent encounter for fracture with routine healing: Secondary | ICD-10-CM | POA: Diagnosis not present

## 2016-06-14 DIAGNOSIS — S92252D Displaced fracture of navicular [scaphoid] of left foot, subsequent encounter for fracture with routine healing: Secondary | ICD-10-CM | POA: Diagnosis not present

## 2016-06-14 DIAGNOSIS — S06360D Traumatic hemorrhage of cerebrum, unspecified, without loss of consciousness, subsequent encounter: Secondary | ICD-10-CM | POA: Diagnosis not present

## 2016-06-14 DIAGNOSIS — S41102D Unspecified open wound of left upper arm, subsequent encounter: Secondary | ICD-10-CM | POA: Diagnosis not present

## 2016-06-19 DIAGNOSIS — S41102D Unspecified open wound of left upper arm, subsequent encounter: Secondary | ICD-10-CM | POA: Diagnosis not present

## 2016-06-19 DIAGNOSIS — S92344D Nondisplaced fracture of fourth metatarsal bone, right foot, subsequent encounter for fracture with routine healing: Secondary | ICD-10-CM | POA: Diagnosis not present

## 2016-06-19 DIAGNOSIS — S92252D Displaced fracture of navicular [scaphoid] of left foot, subsequent encounter for fracture with routine healing: Secondary | ICD-10-CM | POA: Diagnosis not present

## 2016-06-19 DIAGNOSIS — S22009D Unspecified fracture of unspecified thoracic vertebra, subsequent encounter for fracture with routine healing: Secondary | ICD-10-CM | POA: Diagnosis not present

## 2016-06-19 DIAGNOSIS — S06360D Traumatic hemorrhage of cerebrum, unspecified, without loss of consciousness, subsequent encounter: Secondary | ICD-10-CM | POA: Diagnosis not present

## 2016-06-19 DIAGNOSIS — S2242XD Multiple fractures of ribs, left side, subsequent encounter for fracture with routine healing: Secondary | ICD-10-CM | POA: Diagnosis not present

## 2016-06-22 DIAGNOSIS — S92252D Displaced fracture of navicular [scaphoid] of left foot, subsequent encounter for fracture with routine healing: Secondary | ICD-10-CM | POA: Diagnosis not present

## 2016-06-22 DIAGNOSIS — S4382XA Sprain of other specified parts of left shoulder girdle, initial encounter: Secondary | ICD-10-CM | POA: Diagnosis not present

## 2016-06-22 DIAGNOSIS — M7552 Bursitis of left shoulder: Secondary | ICD-10-CM | POA: Diagnosis not present

## 2016-06-22 DIAGNOSIS — S22009D Unspecified fracture of unspecified thoracic vertebra, subsequent encounter for fracture with routine healing: Secondary | ICD-10-CM | POA: Diagnosis not present

## 2016-06-22 DIAGNOSIS — S06360D Traumatic hemorrhage of cerebrum, unspecified, without loss of consciousness, subsequent encounter: Secondary | ICD-10-CM | POA: Diagnosis not present

## 2016-06-22 DIAGNOSIS — S92344D Nondisplaced fracture of fourth metatarsal bone, right foot, subsequent encounter for fracture with routine healing: Secondary | ICD-10-CM | POA: Diagnosis not present

## 2016-06-22 DIAGNOSIS — S2242XD Multiple fractures of ribs, left side, subsequent encounter for fracture with routine healing: Secondary | ICD-10-CM | POA: Diagnosis not present

## 2016-06-22 DIAGNOSIS — M7522 Bicipital tendinitis, left shoulder: Secondary | ICD-10-CM | POA: Diagnosis not present

## 2016-06-22 DIAGNOSIS — S41102D Unspecified open wound of left upper arm, subsequent encounter: Secondary | ICD-10-CM | POA: Diagnosis not present

## 2016-06-24 DIAGNOSIS — M86172 Other acute osteomyelitis, left ankle and foot: Secondary | ICD-10-CM | POA: Diagnosis not present

## 2016-06-24 DIAGNOSIS — T84629A Infection and inflammatory reaction due to internal fixation device of unspecified bone of leg, initial encounter: Secondary | ICD-10-CM | POA: Diagnosis not present

## 2016-06-24 DIAGNOSIS — J9811 Atelectasis: Secondary | ICD-10-CM | POA: Diagnosis not present

## 2016-06-24 DIAGNOSIS — F17210 Nicotine dependence, cigarettes, uncomplicated: Secondary | ICD-10-CM | POA: Diagnosis not present

## 2016-06-24 DIAGNOSIS — Z9889 Other specified postprocedural states: Secondary | ICD-10-CM | POA: Diagnosis not present

## 2016-06-24 DIAGNOSIS — R509 Fever, unspecified: Secondary | ICD-10-CM | POA: Diagnosis not present

## 2016-06-24 DIAGNOSIS — I10 Essential (primary) hypertension: Secondary | ICD-10-CM | POA: Diagnosis not present

## 2016-06-24 DIAGNOSIS — S92251A Displaced fracture of navicular [scaphoid] of right foot, initial encounter for closed fracture: Secondary | ICD-10-CM | POA: Diagnosis not present

## 2016-06-24 DIAGNOSIS — A419 Sepsis, unspecified organism: Secondary | ICD-10-CM | POA: Diagnosis not present

## 2016-06-24 DIAGNOSIS — L089 Local infection of the skin and subcutaneous tissue, unspecified: Secondary | ICD-10-CM | POA: Diagnosis not present

## 2016-06-24 DIAGNOSIS — L02612 Cutaneous abscess of left foot: Secondary | ICD-10-CM | POA: Diagnosis not present

## 2016-06-24 DIAGNOSIS — M7989 Other specified soft tissue disorders: Secondary | ICD-10-CM | POA: Diagnosis not present

## 2016-06-24 DIAGNOSIS — R000 Tachycardia, unspecified: Secondary | ICD-10-CM | POA: Diagnosis not present

## 2016-06-24 DIAGNOSIS — Q7020 Fused toes, unspecified foot: Secondary | ICD-10-CM | POA: Diagnosis not present

## 2016-06-25 DIAGNOSIS — T8469XA Infection and inflammatory reaction due to internal fixation device of other site, initial encounter: Secondary | ICD-10-CM | POA: Diagnosis not present

## 2016-06-27 DIAGNOSIS — B9561 Methicillin susceptible Staphylococcus aureus infection as the cause of diseases classified elsewhere: Secondary | ICD-10-CM | POA: Diagnosis not present

## 2016-06-27 DIAGNOSIS — F17210 Nicotine dependence, cigarettes, uncomplicated: Secondary | ICD-10-CM | POA: Diagnosis not present

## 2016-06-27 DIAGNOSIS — L02619 Cutaneous abscess of unspecified foot: Secondary | ICD-10-CM | POA: Diagnosis not present

## 2016-06-27 DIAGNOSIS — Z880 Allergy status to penicillin: Secondary | ICD-10-CM | POA: Diagnosis not present

## 2016-06-27 DIAGNOSIS — I10 Essential (primary) hypertension: Secondary | ICD-10-CM | POA: Diagnosis not present

## 2016-06-27 DIAGNOSIS — D72829 Elevated white blood cell count, unspecified: Secondary | ICD-10-CM | POA: Diagnosis not present

## 2016-06-27 DIAGNOSIS — M86172 Other acute osteomyelitis, left ankle and foot: Secondary | ICD-10-CM | POA: Diagnosis not present

## 2016-06-27 DIAGNOSIS — T814XXA Infection following a procedure, initial encounter: Secondary | ICD-10-CM | POA: Diagnosis not present

## 2016-06-28 DIAGNOSIS — L02619 Cutaneous abscess of unspecified foot: Secondary | ICD-10-CM | POA: Diagnosis not present

## 2016-06-28 DIAGNOSIS — T814XXA Infection following a procedure, initial encounter: Secondary | ICD-10-CM | POA: Diagnosis not present

## 2016-06-29 DIAGNOSIS — S92344D Nondisplaced fracture of fourth metatarsal bone, right foot, subsequent encounter for fracture with routine healing: Secondary | ICD-10-CM | POA: Diagnosis not present

## 2016-06-29 DIAGNOSIS — S06360D Traumatic hemorrhage of cerebrum, unspecified, without loss of consciousness, subsequent encounter: Secondary | ICD-10-CM | POA: Diagnosis not present

## 2016-06-29 DIAGNOSIS — S92252D Displaced fracture of navicular [scaphoid] of left foot, subsequent encounter for fracture with routine healing: Secondary | ICD-10-CM | POA: Diagnosis not present

## 2016-06-29 DIAGNOSIS — S2242XD Multiple fractures of ribs, left side, subsequent encounter for fracture with routine healing: Secondary | ICD-10-CM | POA: Diagnosis not present

## 2016-06-29 DIAGNOSIS — S41102D Unspecified open wound of left upper arm, subsequent encounter: Secondary | ICD-10-CM | POA: Diagnosis not present

## 2016-06-29 DIAGNOSIS — S22009D Unspecified fracture of unspecified thoracic vertebra, subsequent encounter for fracture with routine healing: Secondary | ICD-10-CM | POA: Diagnosis not present

## 2016-07-06 DIAGNOSIS — S92252D Displaced fracture of navicular [scaphoid] of left foot, subsequent encounter for fracture with routine healing: Secondary | ICD-10-CM | POA: Diagnosis not present

## 2016-07-06 DIAGNOSIS — S06360D Traumatic hemorrhage of cerebrum, unspecified, without loss of consciousness, subsequent encounter: Secondary | ICD-10-CM | POA: Diagnosis not present

## 2016-07-06 DIAGNOSIS — A419 Sepsis, unspecified organism: Secondary | ICD-10-CM | POA: Diagnosis not present

## 2016-07-06 DIAGNOSIS — S92344D Nondisplaced fracture of fourth metatarsal bone, right foot, subsequent encounter for fracture with routine healing: Secondary | ICD-10-CM | POA: Diagnosis not present

## 2016-07-06 DIAGNOSIS — S41102D Unspecified open wound of left upper arm, subsequent encounter: Secondary | ICD-10-CM | POA: Diagnosis not present

## 2016-07-06 DIAGNOSIS — S2242XD Multiple fractures of ribs, left side, subsequent encounter for fracture with routine healing: Secondary | ICD-10-CM | POA: Diagnosis not present

## 2016-07-06 DIAGNOSIS — S22009D Unspecified fracture of unspecified thoracic vertebra, subsequent encounter for fracture with routine healing: Secondary | ICD-10-CM | POA: Diagnosis not present

## 2016-07-09 DIAGNOSIS — S41102D Unspecified open wound of left upper arm, subsequent encounter: Secondary | ICD-10-CM | POA: Diagnosis not present

## 2016-07-09 DIAGNOSIS — S06360D Traumatic hemorrhage of cerebrum, unspecified, without loss of consciousness, subsequent encounter: Secondary | ICD-10-CM | POA: Diagnosis not present

## 2016-07-09 DIAGNOSIS — S22009D Unspecified fracture of unspecified thoracic vertebra, subsequent encounter for fracture with routine healing: Secondary | ICD-10-CM | POA: Diagnosis not present

## 2016-07-09 DIAGNOSIS — S92252D Displaced fracture of navicular [scaphoid] of left foot, subsequent encounter for fracture with routine healing: Secondary | ICD-10-CM | POA: Diagnosis not present

## 2016-07-09 DIAGNOSIS — S92344D Nondisplaced fracture of fourth metatarsal bone, right foot, subsequent encounter for fracture with routine healing: Secondary | ICD-10-CM | POA: Diagnosis not present

## 2016-07-09 DIAGNOSIS — S2242XD Multiple fractures of ribs, left side, subsequent encounter for fracture with routine healing: Secondary | ICD-10-CM | POA: Diagnosis not present

## 2016-07-10 DIAGNOSIS — M86672 Other chronic osteomyelitis, left ankle and foot: Secondary | ICD-10-CM | POA: Diagnosis not present

## 2016-07-10 DIAGNOSIS — I10 Essential (primary) hypertension: Secondary | ICD-10-CM | POA: Diagnosis not present

## 2016-07-10 DIAGNOSIS — Z0000 Encounter for general adult medical examination without abnormal findings: Secondary | ICD-10-CM | POA: Diagnosis not present

## 2016-07-10 DIAGNOSIS — Z885 Allergy status to narcotic agent status: Secondary | ICD-10-CM | POA: Diagnosis not present

## 2016-07-10 DIAGNOSIS — Z7982 Long term (current) use of aspirin: Secondary | ICD-10-CM | POA: Diagnosis not present

## 2016-07-10 DIAGNOSIS — Z5181 Encounter for therapeutic drug level monitoring: Secondary | ICD-10-CM | POA: Diagnosis not present

## 2016-07-10 DIAGNOSIS — Z886 Allergy status to analgesic agent status: Secondary | ICD-10-CM | POA: Diagnosis not present

## 2016-07-10 DIAGNOSIS — Z09 Encounter for follow-up examination after completed treatment for conditions other than malignant neoplasm: Secondary | ICD-10-CM | POA: Diagnosis not present

## 2016-07-10 DIAGNOSIS — Z880 Allergy status to penicillin: Secondary | ICD-10-CM | POA: Diagnosis not present

## 2016-07-10 DIAGNOSIS — Z792 Long term (current) use of antibiotics: Secondary | ICD-10-CM | POA: Diagnosis not present

## 2016-07-10 DIAGNOSIS — Z9889 Other specified postprocedural states: Secondary | ICD-10-CM | POA: Diagnosis not present

## 2016-07-10 DIAGNOSIS — S92252D Displaced fracture of navicular [scaphoid] of left foot, subsequent encounter for fracture with routine healing: Secondary | ICD-10-CM | POA: Diagnosis not present

## 2016-07-10 DIAGNOSIS — Z888 Allergy status to other drugs, medicaments and biological substances status: Secondary | ICD-10-CM | POA: Diagnosis not present

## 2016-07-10 DIAGNOSIS — Z79899 Other long term (current) drug therapy: Secondary | ICD-10-CM | POA: Diagnosis not present

## 2016-07-11 DIAGNOSIS — Z888 Allergy status to other drugs, medicaments and biological substances status: Secondary | ICD-10-CM | POA: Diagnosis not present

## 2016-07-11 DIAGNOSIS — Z885 Allergy status to narcotic agent status: Secondary | ICD-10-CM | POA: Diagnosis not present

## 2016-07-11 DIAGNOSIS — Z880 Allergy status to penicillin: Secondary | ICD-10-CM | POA: Diagnosis not present

## 2016-07-11 DIAGNOSIS — S46812A Strain of other muscles, fascia and tendons at shoulder and upper arm level, left arm, initial encounter: Secondary | ICD-10-CM | POA: Diagnosis not present

## 2016-07-11 DIAGNOSIS — Z87891 Personal history of nicotine dependence: Secondary | ICD-10-CM | POA: Diagnosis not present

## 2016-07-11 DIAGNOSIS — I10 Essential (primary) hypertension: Secondary | ICD-10-CM | POA: Diagnosis not present

## 2016-07-13 DIAGNOSIS — S41102D Unspecified open wound of left upper arm, subsequent encounter: Secondary | ICD-10-CM | POA: Diagnosis not present

## 2016-07-13 DIAGNOSIS — S92344D Nondisplaced fracture of fourth metatarsal bone, right foot, subsequent encounter for fracture with routine healing: Secondary | ICD-10-CM | POA: Diagnosis not present

## 2016-07-13 DIAGNOSIS — S2242XD Multiple fractures of ribs, left side, subsequent encounter for fracture with routine healing: Secondary | ICD-10-CM | POA: Diagnosis not present

## 2016-07-13 DIAGNOSIS — S06360D Traumatic hemorrhage of cerebrum, unspecified, without loss of consciousness, subsequent encounter: Secondary | ICD-10-CM | POA: Diagnosis not present

## 2016-07-13 DIAGNOSIS — S22009D Unspecified fracture of unspecified thoracic vertebra, subsequent encounter for fracture with routine healing: Secondary | ICD-10-CM | POA: Diagnosis not present

## 2016-07-13 DIAGNOSIS — S92252D Displaced fracture of navicular [scaphoid] of left foot, subsequent encounter for fracture with routine healing: Secondary | ICD-10-CM | POA: Diagnosis not present

## 2016-07-19 DIAGNOSIS — Z4802 Encounter for removal of sutures: Secondary | ICD-10-CM | POA: Diagnosis not present

## 2016-07-19 DIAGNOSIS — Z888 Allergy status to other drugs, medicaments and biological substances status: Secondary | ICD-10-CM | POA: Diagnosis not present

## 2016-07-19 DIAGNOSIS — S92222D Displaced fracture of lateral cuneiform of left foot, subsequent encounter for fracture with routine healing: Secondary | ICD-10-CM | POA: Diagnosis not present

## 2016-07-19 DIAGNOSIS — S92252D Displaced fracture of navicular [scaphoid] of left foot, subsequent encounter for fracture with routine healing: Secondary | ICD-10-CM | POA: Diagnosis not present

## 2016-07-19 DIAGNOSIS — Z886 Allergy status to analgesic agent status: Secondary | ICD-10-CM | POA: Diagnosis not present

## 2016-07-19 DIAGNOSIS — Z885 Allergy status to narcotic agent status: Secondary | ICD-10-CM | POA: Diagnosis not present

## 2016-07-19 DIAGNOSIS — Z880 Allergy status to penicillin: Secondary | ICD-10-CM | POA: Diagnosis not present

## 2016-07-19 DIAGNOSIS — S92332D Displaced fracture of third metatarsal bone, left foot, subsequent encounter for fracture with routine healing: Secondary | ICD-10-CM | POA: Diagnosis not present

## 2016-07-19 DIAGNOSIS — S92232D Displaced fracture of intermediate cuneiform of left foot, subsequent encounter for fracture with routine healing: Secondary | ICD-10-CM | POA: Diagnosis not present

## 2016-07-19 DIAGNOSIS — S92122D Displaced fracture of body of left talus, subsequent encounter for fracture with routine healing: Secondary | ICD-10-CM | POA: Diagnosis not present

## 2016-07-20 DIAGNOSIS — S92344D Nondisplaced fracture of fourth metatarsal bone, right foot, subsequent encounter for fracture with routine healing: Secondary | ICD-10-CM | POA: Diagnosis not present

## 2016-07-20 DIAGNOSIS — S41102D Unspecified open wound of left upper arm, subsequent encounter: Secondary | ICD-10-CM | POA: Diagnosis not present

## 2016-07-20 DIAGNOSIS — S2242XD Multiple fractures of ribs, left side, subsequent encounter for fracture with routine healing: Secondary | ICD-10-CM | POA: Diagnosis not present

## 2016-07-20 DIAGNOSIS — S22009D Unspecified fracture of unspecified thoracic vertebra, subsequent encounter for fracture with routine healing: Secondary | ICD-10-CM | POA: Diagnosis not present

## 2016-07-20 DIAGNOSIS — S92252D Displaced fracture of navicular [scaphoid] of left foot, subsequent encounter for fracture with routine healing: Secondary | ICD-10-CM | POA: Diagnosis not present

## 2016-07-20 DIAGNOSIS — T8469XA Infection and inflammatory reaction due to internal fixation device of other site, initial encounter: Secondary | ICD-10-CM | POA: Diagnosis not present

## 2016-07-20 DIAGNOSIS — S06360D Traumatic hemorrhage of cerebrum, unspecified, without loss of consciousness, subsequent encounter: Secondary | ICD-10-CM | POA: Diagnosis not present

## 2016-07-24 DIAGNOSIS — S2242XD Multiple fractures of ribs, left side, subsequent encounter for fracture with routine healing: Secondary | ICD-10-CM | POA: Diagnosis not present

## 2016-07-24 DIAGNOSIS — S22009D Unspecified fracture of unspecified thoracic vertebra, subsequent encounter for fracture with routine healing: Secondary | ICD-10-CM | POA: Diagnosis not present

## 2016-07-24 DIAGNOSIS — S92344D Nondisplaced fracture of fourth metatarsal bone, right foot, subsequent encounter for fracture with routine healing: Secondary | ICD-10-CM | POA: Diagnosis not present

## 2016-07-24 DIAGNOSIS — S06360D Traumatic hemorrhage of cerebrum, unspecified, without loss of consciousness, subsequent encounter: Secondary | ICD-10-CM | POA: Diagnosis not present

## 2016-07-24 DIAGNOSIS — S41102D Unspecified open wound of left upper arm, subsequent encounter: Secondary | ICD-10-CM | POA: Diagnosis not present

## 2016-07-24 DIAGNOSIS — S92252D Displaced fracture of navicular [scaphoid] of left foot, subsequent encounter for fracture with routine healing: Secondary | ICD-10-CM | POA: Diagnosis not present

## 2016-07-24 DIAGNOSIS — A419 Sepsis, unspecified organism: Secondary | ICD-10-CM | POA: Diagnosis not present

## 2016-07-25 DIAGNOSIS — B379 Candidiasis, unspecified: Secondary | ICD-10-CM | POA: Diagnosis not present

## 2016-07-25 DIAGNOSIS — F419 Anxiety disorder, unspecified: Secondary | ICD-10-CM | POA: Diagnosis not present

## 2016-07-25 DIAGNOSIS — I10 Essential (primary) hypertension: Secondary | ICD-10-CM | POA: Diagnosis not present

## 2016-07-25 DIAGNOSIS — G4700 Insomnia, unspecified: Secondary | ICD-10-CM | POA: Diagnosis not present

## 2016-07-25 DIAGNOSIS — Z6833 Body mass index (BMI) 33.0-33.9, adult: Secondary | ICD-10-CM | POA: Diagnosis not present

## 2016-07-29 DIAGNOSIS — F17210 Nicotine dependence, cigarettes, uncomplicated: Secondary | ICD-10-CM | POA: Diagnosis not present

## 2016-07-29 DIAGNOSIS — L03116 Cellulitis of left lower limb: Secondary | ICD-10-CM | POA: Diagnosis not present

## 2016-07-29 DIAGNOSIS — A419 Sepsis, unspecified organism: Secondary | ICD-10-CM | POA: Diagnosis not present

## 2016-07-29 DIAGNOSIS — I10 Essential (primary) hypertension: Secondary | ICD-10-CM | POA: Diagnosis not present

## 2016-07-29 DIAGNOSIS — S22009D Unspecified fracture of unspecified thoracic vertebra, subsequent encounter for fracture with routine healing: Secondary | ICD-10-CM | POA: Diagnosis not present

## 2016-07-29 DIAGNOSIS — S92344D Nondisplaced fracture of fourth metatarsal bone, right foot, subsequent encounter for fracture with routine healing: Secondary | ICD-10-CM | POA: Diagnosis not present

## 2016-07-29 DIAGNOSIS — S12601D Unspecified nondisplaced fracture of seventh cervical vertebra, subsequent encounter for fracture with routine healing: Secondary | ICD-10-CM | POA: Diagnosis not present

## 2016-07-29 DIAGNOSIS — T8469XA Infection and inflammatory reaction due to internal fixation device of other site, initial encounter: Secondary | ICD-10-CM | POA: Diagnosis not present

## 2016-07-29 DIAGNOSIS — F419 Anxiety disorder, unspecified: Secondary | ICD-10-CM | POA: Diagnosis not present

## 2016-07-29 DIAGNOSIS — S92302D Fracture of unspecified metatarsal bone(s), left foot, subsequent encounter for fracture with routine healing: Secondary | ICD-10-CM | POA: Diagnosis not present

## 2016-07-29 DIAGNOSIS — S92252D Displaced fracture of navicular [scaphoid] of left foot, subsequent encounter for fracture with routine healing: Secondary | ICD-10-CM | POA: Diagnosis not present

## 2016-07-29 DIAGNOSIS — S2242XD Multiple fractures of ribs, left side, subsequent encounter for fracture with routine healing: Secondary | ICD-10-CM | POA: Diagnosis not present

## 2016-07-29 DIAGNOSIS — Z452 Encounter for adjustment and management of vascular access device: Secondary | ICD-10-CM | POA: Diagnosis not present

## 2016-07-29 DIAGNOSIS — S06360D Traumatic hemorrhage of cerebrum, unspecified, without loss of consciousness, subsequent encounter: Secondary | ICD-10-CM | POA: Diagnosis not present

## 2016-07-29 DIAGNOSIS — Z792 Long term (current) use of antibiotics: Secondary | ICD-10-CM | POA: Diagnosis not present

## 2016-07-29 DIAGNOSIS — F329 Major depressive disorder, single episode, unspecified: Secondary | ICD-10-CM | POA: Diagnosis not present

## 2016-07-31 DIAGNOSIS — A419 Sepsis, unspecified organism: Secondary | ICD-10-CM | POA: Diagnosis not present

## 2016-07-31 DIAGNOSIS — S92252D Displaced fracture of navicular [scaphoid] of left foot, subsequent encounter for fracture with routine healing: Secondary | ICD-10-CM | POA: Diagnosis not present

## 2016-07-31 DIAGNOSIS — S06360D Traumatic hemorrhage of cerebrum, unspecified, without loss of consciousness, subsequent encounter: Secondary | ICD-10-CM | POA: Diagnosis not present

## 2016-07-31 DIAGNOSIS — L03116 Cellulitis of left lower limb: Secondary | ICD-10-CM | POA: Diagnosis not present

## 2016-07-31 DIAGNOSIS — S92302D Fracture of unspecified metatarsal bone(s), left foot, subsequent encounter for fracture with routine healing: Secondary | ICD-10-CM | POA: Diagnosis not present

## 2016-07-31 DIAGNOSIS — I110 Hypertensive heart disease with heart failure: Secondary | ICD-10-CM | POA: Diagnosis not present

## 2016-07-31 DIAGNOSIS — T8469XA Infection and inflammatory reaction due to internal fixation device of other site, initial encounter: Secondary | ICD-10-CM | POA: Diagnosis not present

## 2016-08-07 DIAGNOSIS — A419 Sepsis, unspecified organism: Secondary | ICD-10-CM | POA: Diagnosis not present

## 2016-08-07 DIAGNOSIS — S92302D Fracture of unspecified metatarsal bone(s), left foot, subsequent encounter for fracture with routine healing: Secondary | ICD-10-CM | POA: Diagnosis not present

## 2016-08-07 DIAGNOSIS — S92252D Displaced fracture of navicular [scaphoid] of left foot, subsequent encounter for fracture with routine healing: Secondary | ICD-10-CM | POA: Diagnosis not present

## 2016-08-07 DIAGNOSIS — S06360D Traumatic hemorrhage of cerebrum, unspecified, without loss of consciousness, subsequent encounter: Secondary | ICD-10-CM | POA: Diagnosis not present

## 2016-08-07 DIAGNOSIS — T8469XA Infection and inflammatory reaction due to internal fixation device of other site, initial encounter: Secondary | ICD-10-CM | POA: Diagnosis not present

## 2016-08-07 DIAGNOSIS — L03116 Cellulitis of left lower limb: Secondary | ICD-10-CM | POA: Diagnosis not present

## 2016-08-09 DIAGNOSIS — T814XXD Infection following a procedure, subsequent encounter: Secondary | ICD-10-CM | POA: Diagnosis not present

## 2016-08-09 DIAGNOSIS — S92252D Displaced fracture of navicular [scaphoid] of left foot, subsequent encounter for fracture with routine healing: Secondary | ICD-10-CM | POA: Diagnosis not present

## 2016-08-14 DIAGNOSIS — S92302D Fracture of unspecified metatarsal bone(s), left foot, subsequent encounter for fracture with routine healing: Secondary | ICD-10-CM | POA: Diagnosis not present

## 2016-08-14 DIAGNOSIS — S92252D Displaced fracture of navicular [scaphoid] of left foot, subsequent encounter for fracture with routine healing: Secondary | ICD-10-CM | POA: Diagnosis not present

## 2016-08-14 DIAGNOSIS — T8469XA Infection and inflammatory reaction due to internal fixation device of other site, initial encounter: Secondary | ICD-10-CM | POA: Diagnosis not present

## 2016-08-14 DIAGNOSIS — S06360D Traumatic hemorrhage of cerebrum, unspecified, without loss of consciousness, subsequent encounter: Secondary | ICD-10-CM | POA: Diagnosis not present

## 2016-08-14 DIAGNOSIS — A419 Sepsis, unspecified organism: Secondary | ICD-10-CM | POA: Diagnosis not present

## 2016-08-14 DIAGNOSIS — L03116 Cellulitis of left lower limb: Secondary | ICD-10-CM | POA: Diagnosis not present

## 2016-08-15 DIAGNOSIS — M5442 Lumbago with sciatica, left side: Secondary | ICD-10-CM | POA: Diagnosis not present

## 2016-08-15 DIAGNOSIS — G603 Idiopathic progressive neuropathy: Secondary | ICD-10-CM | POA: Diagnosis not present

## 2016-08-15 DIAGNOSIS — G894 Chronic pain syndrome: Secondary | ICD-10-CM | POA: Diagnosis not present

## 2016-08-15 DIAGNOSIS — M542 Cervicalgia: Secondary | ICD-10-CM | POA: Diagnosis not present

## 2016-08-15 DIAGNOSIS — M79605 Pain in left leg: Secondary | ICD-10-CM | POA: Diagnosis not present

## 2016-08-15 DIAGNOSIS — M5431 Sciatica, right side: Secondary | ICD-10-CM | POA: Diagnosis not present

## 2016-08-15 DIAGNOSIS — Z79891 Long term (current) use of opiate analgesic: Secondary | ICD-10-CM | POA: Diagnosis not present

## 2016-08-22 DIAGNOSIS — S06360D Traumatic hemorrhage of cerebrum, unspecified, without loss of consciousness, subsequent encounter: Secondary | ICD-10-CM | POA: Diagnosis not present

## 2016-08-22 DIAGNOSIS — A419 Sepsis, unspecified organism: Secondary | ICD-10-CM | POA: Diagnosis not present

## 2016-08-22 DIAGNOSIS — G894 Chronic pain syndrome: Secondary | ICD-10-CM | POA: Diagnosis not present

## 2016-08-22 DIAGNOSIS — M542 Cervicalgia: Secondary | ICD-10-CM | POA: Diagnosis not present

## 2016-08-22 DIAGNOSIS — G603 Idiopathic progressive neuropathy: Secondary | ICD-10-CM | POA: Diagnosis not present

## 2016-08-22 DIAGNOSIS — T8469XA Infection and inflammatory reaction due to internal fixation device of other site, initial encounter: Secondary | ICD-10-CM | POA: Diagnosis not present

## 2016-08-22 DIAGNOSIS — S92302D Fracture of unspecified metatarsal bone(s), left foot, subsequent encounter for fracture with routine healing: Secondary | ICD-10-CM | POA: Diagnosis not present

## 2016-08-22 DIAGNOSIS — M79605 Pain in left leg: Secondary | ICD-10-CM | POA: Diagnosis not present

## 2016-08-22 DIAGNOSIS — L03116 Cellulitis of left lower limb: Secondary | ICD-10-CM | POA: Diagnosis not present

## 2016-08-22 DIAGNOSIS — S92252D Displaced fracture of navicular [scaphoid] of left foot, subsequent encounter for fracture with routine healing: Secondary | ICD-10-CM | POA: Diagnosis not present

## 2016-08-28 DIAGNOSIS — M899 Disorder of bone, unspecified: Secondary | ICD-10-CM | POA: Diagnosis not present

## 2016-08-28 DIAGNOSIS — R600 Localized edema: Secondary | ICD-10-CM | POA: Diagnosis not present

## 2016-08-28 DIAGNOSIS — Y92410 Unspecified street and highway as the place of occurrence of the external cause: Secondary | ICD-10-CM | POA: Diagnosis not present

## 2016-08-28 DIAGNOSIS — M79672 Pain in left foot: Secondary | ICD-10-CM | POA: Diagnosis not present

## 2016-08-28 DIAGNOSIS — Y999 Unspecified external cause status: Secondary | ICD-10-CM | POA: Diagnosis not present

## 2016-08-28 DIAGNOSIS — M25572 Pain in left ankle and joints of left foot: Secondary | ICD-10-CM | POA: Diagnosis not present

## 2016-08-30 DIAGNOSIS — Z885 Allergy status to narcotic agent status: Secondary | ICD-10-CM | POA: Diagnosis not present

## 2016-08-30 DIAGNOSIS — Z880 Allergy status to penicillin: Secondary | ICD-10-CM | POA: Diagnosis not present

## 2016-08-30 DIAGNOSIS — R600 Localized edema: Secondary | ICD-10-CM | POA: Diagnosis not present

## 2016-08-30 DIAGNOSIS — S92252D Displaced fracture of navicular [scaphoid] of left foot, subsequent encounter for fracture with routine healing: Secondary | ICD-10-CM | POA: Diagnosis not present

## 2016-08-30 DIAGNOSIS — Z7982 Long term (current) use of aspirin: Secondary | ICD-10-CM | POA: Diagnosis not present

## 2016-08-30 DIAGNOSIS — Z888 Allergy status to other drugs, medicaments and biological substances status: Secondary | ICD-10-CM | POA: Diagnosis not present

## 2016-08-30 DIAGNOSIS — Z79899 Other long term (current) drug therapy: Secondary | ICD-10-CM | POA: Diagnosis not present

## 2016-08-30 DIAGNOSIS — Z886 Allergy status to analgesic agent status: Secondary | ICD-10-CM | POA: Diagnosis not present

## 2016-08-30 DIAGNOSIS — B999 Unspecified infectious disease: Secondary | ICD-10-CM | POA: Diagnosis not present

## 2016-08-30 DIAGNOSIS — T814XXD Infection following a procedure, subsequent encounter: Secondary | ICD-10-CM | POA: Diagnosis not present

## 2016-09-04 DIAGNOSIS — Z6833 Body mass index (BMI) 33.0-33.9, adult: Secondary | ICD-10-CM | POA: Diagnosis not present

## 2016-09-04 DIAGNOSIS — F419 Anxiety disorder, unspecified: Secondary | ICD-10-CM | POA: Diagnosis not present

## 2016-09-04 DIAGNOSIS — G4700 Insomnia, unspecified: Secondary | ICD-10-CM | POA: Diagnosis not present

## 2016-09-04 DIAGNOSIS — I10 Essential (primary) hypertension: Secondary | ICD-10-CM | POA: Diagnosis not present

## 2016-09-04 DIAGNOSIS — G629 Polyneuropathy, unspecified: Secondary | ICD-10-CM | POA: Diagnosis not present

## 2016-09-12 DIAGNOSIS — Z885 Allergy status to narcotic agent status: Secondary | ICD-10-CM | POA: Diagnosis not present

## 2016-09-12 DIAGNOSIS — Z888 Allergy status to other drugs, medicaments and biological substances status: Secondary | ICD-10-CM | POA: Diagnosis not present

## 2016-09-12 DIAGNOSIS — Z880 Allergy status to penicillin: Secondary | ICD-10-CM | POA: Diagnosis not present

## 2016-09-12 DIAGNOSIS — Z886 Allergy status to analgesic agent status: Secondary | ICD-10-CM | POA: Diagnosis not present

## 2016-09-12 DIAGNOSIS — M75122 Complete rotator cuff tear or rupture of left shoulder, not specified as traumatic: Secondary | ICD-10-CM | POA: Diagnosis not present

## 2016-09-26 DIAGNOSIS — M542 Cervicalgia: Secondary | ICD-10-CM | POA: Diagnosis not present

## 2016-09-26 DIAGNOSIS — M79605 Pain in left leg: Secondary | ICD-10-CM | POA: Diagnosis not present

## 2016-09-26 DIAGNOSIS — Z79891 Long term (current) use of opiate analgesic: Secondary | ICD-10-CM | POA: Diagnosis not present

## 2016-09-26 DIAGNOSIS — G894 Chronic pain syndrome: Secondary | ICD-10-CM | POA: Diagnosis not present

## 2016-09-26 DIAGNOSIS — G890 Central pain syndrome: Secondary | ICD-10-CM | POA: Diagnosis not present

## 2016-09-28 DIAGNOSIS — M75112 Incomplete rotator cuff tear or rupture of left shoulder, not specified as traumatic: Secondary | ICD-10-CM | POA: Diagnosis not present

## 2016-09-28 DIAGNOSIS — M7522 Bicipital tendinitis, left shoulder: Secondary | ICD-10-CM | POA: Diagnosis not present

## 2016-09-28 DIAGNOSIS — M75122 Complete rotator cuff tear or rupture of left shoulder, not specified as traumatic: Secondary | ICD-10-CM | POA: Diagnosis not present

## 2016-09-28 DIAGNOSIS — F17210 Nicotine dependence, cigarettes, uncomplicated: Secondary | ICD-10-CM | POA: Diagnosis not present

## 2016-09-28 DIAGNOSIS — K219 Gastro-esophageal reflux disease without esophagitis: Secondary | ICD-10-CM | POA: Diagnosis not present

## 2016-09-28 DIAGNOSIS — I10 Essential (primary) hypertension: Secondary | ICD-10-CM | POA: Diagnosis not present

## 2016-09-28 DIAGNOSIS — G629 Polyneuropathy, unspecified: Secondary | ICD-10-CM | POA: Diagnosis not present

## 2016-09-28 DIAGNOSIS — S41002A Unspecified open wound of left shoulder, initial encounter: Secondary | ICD-10-CM | POA: Diagnosis not present

## 2016-09-28 DIAGNOSIS — F419 Anxiety disorder, unspecified: Secondary | ICD-10-CM | POA: Diagnosis not present

## 2016-10-01 DIAGNOSIS — M79605 Pain in left leg: Secondary | ICD-10-CM | POA: Diagnosis not present

## 2016-10-01 DIAGNOSIS — Z79891 Long term (current) use of opiate analgesic: Secondary | ICD-10-CM | POA: Diagnosis not present

## 2016-10-01 DIAGNOSIS — G894 Chronic pain syndrome: Secondary | ICD-10-CM | POA: Diagnosis not present

## 2016-10-01 DIAGNOSIS — M542 Cervicalgia: Secondary | ICD-10-CM | POA: Diagnosis not present

## 2016-10-02 DIAGNOSIS — M79605 Pain in left leg: Secondary | ICD-10-CM | POA: Diagnosis not present

## 2016-10-02 DIAGNOSIS — G894 Chronic pain syndrome: Secondary | ICD-10-CM | POA: Diagnosis not present

## 2016-10-02 DIAGNOSIS — Z79891 Long term (current) use of opiate analgesic: Secondary | ICD-10-CM | POA: Diagnosis not present

## 2016-10-02 DIAGNOSIS — M542 Cervicalgia: Secondary | ICD-10-CM | POA: Diagnosis not present

## 2016-10-20 DIAGNOSIS — M5136 Other intervertebral disc degeneration, lumbar region: Secondary | ICD-10-CM | POA: Diagnosis not present

## 2016-10-20 DIAGNOSIS — M5440 Lumbago with sciatica, unspecified side: Secondary | ICD-10-CM | POA: Diagnosis not present

## 2016-10-20 DIAGNOSIS — M5416 Radiculopathy, lumbar region: Secondary | ICD-10-CM | POA: Diagnosis not present

## 2016-10-22 DIAGNOSIS — Z885 Allergy status to narcotic agent status: Secondary | ICD-10-CM | POA: Diagnosis not present

## 2016-10-22 DIAGNOSIS — M5416 Radiculopathy, lumbar region: Secondary | ICD-10-CM | POA: Diagnosis not present

## 2016-10-22 DIAGNOSIS — Z886 Allergy status to analgesic agent status: Secondary | ICD-10-CM | POA: Diagnosis not present

## 2016-10-22 DIAGNOSIS — Z880 Allergy status to penicillin: Secondary | ICD-10-CM | POA: Diagnosis not present

## 2016-10-22 DIAGNOSIS — M542 Cervicalgia: Secondary | ICD-10-CM | POA: Diagnosis not present

## 2016-10-22 DIAGNOSIS — Z888 Allergy status to other drugs, medicaments and biological substances status: Secondary | ICD-10-CM | POA: Diagnosis not present

## 2016-10-22 DIAGNOSIS — Z79899 Other long term (current) drug therapy: Secondary | ICD-10-CM | POA: Diagnosis not present

## 2016-10-22 DIAGNOSIS — M75122 Complete rotator cuff tear or rupture of left shoulder, not specified as traumatic: Secondary | ICD-10-CM | POA: Diagnosis not present

## 2016-10-22 DIAGNOSIS — Z87891 Personal history of nicotine dependence: Secondary | ICD-10-CM | POA: Diagnosis not present

## 2016-10-22 DIAGNOSIS — M545 Low back pain: Secondary | ICD-10-CM | POA: Diagnosis not present

## 2016-10-22 DIAGNOSIS — I10 Essential (primary) hypertension: Secondary | ICD-10-CM | POA: Diagnosis not present

## 2016-10-22 DIAGNOSIS — E785 Hyperlipidemia, unspecified: Secondary | ICD-10-CM | POA: Diagnosis not present

## 2016-10-30 DIAGNOSIS — M542 Cervicalgia: Secondary | ICD-10-CM | POA: Diagnosis not present

## 2016-10-30 DIAGNOSIS — Z79891 Long term (current) use of opiate analgesic: Secondary | ICD-10-CM | POA: Diagnosis not present

## 2016-10-30 DIAGNOSIS — M79605 Pain in left leg: Secondary | ICD-10-CM | POA: Diagnosis not present

## 2016-10-30 DIAGNOSIS — G894 Chronic pain syndrome: Secondary | ICD-10-CM | POA: Diagnosis not present

## 2016-11-03 DIAGNOSIS — M545 Low back pain: Secondary | ICD-10-CM | POA: Diagnosis not present

## 2016-11-03 DIAGNOSIS — M5116 Intervertebral disc disorders with radiculopathy, lumbar region: Secondary | ICD-10-CM | POA: Diagnosis not present

## 2016-11-03 DIAGNOSIS — M5117 Intervertebral disc disorders with radiculopathy, lumbosacral region: Secondary | ICD-10-CM | POA: Diagnosis not present

## 2016-11-03 DIAGNOSIS — M5416 Radiculopathy, lumbar region: Secondary | ICD-10-CM | POA: Diagnosis not present

## 2016-11-03 DIAGNOSIS — Z9889 Other specified postprocedural states: Secondary | ICD-10-CM | POA: Diagnosis not present

## 2016-11-12 DIAGNOSIS — J449 Chronic obstructive pulmonary disease, unspecified: Secondary | ICD-10-CM | POA: Diagnosis not present

## 2016-11-12 DIAGNOSIS — M48061 Spinal stenosis, lumbar region without neurogenic claudication: Secondary | ICD-10-CM | POA: Diagnosis not present

## 2016-11-12 DIAGNOSIS — M549 Dorsalgia, unspecified: Secondary | ICD-10-CM | POA: Diagnosis not present

## 2016-11-12 DIAGNOSIS — Z880 Allergy status to penicillin: Secondary | ICD-10-CM | POA: Diagnosis not present

## 2016-11-12 DIAGNOSIS — F17210 Nicotine dependence, cigarettes, uncomplicated: Secondary | ICD-10-CM | POA: Diagnosis not present

## 2016-11-12 DIAGNOSIS — I10 Essential (primary) hypertension: Secondary | ICD-10-CM | POA: Diagnosis not present

## 2016-11-12 DIAGNOSIS — M5126 Other intervertebral disc displacement, lumbar region: Secondary | ICD-10-CM | POA: Diagnosis not present

## 2016-11-22 DIAGNOSIS — M4726 Other spondylosis with radiculopathy, lumbar region: Secondary | ICD-10-CM | POA: Diagnosis not present

## 2016-11-22 DIAGNOSIS — M5116 Intervertebral disc disorders with radiculopathy, lumbar region: Secondary | ICD-10-CM | POA: Diagnosis not present

## 2016-11-22 DIAGNOSIS — M961 Postlaminectomy syndrome, not elsewhere classified: Secondary | ICD-10-CM | POA: Diagnosis not present

## 2016-11-22 DIAGNOSIS — M5432 Sciatica, left side: Secondary | ICD-10-CM | POA: Diagnosis not present

## 2016-12-07 DIAGNOSIS — M5432 Sciatica, left side: Secondary | ICD-10-CM | POA: Diagnosis not present

## 2016-12-07 DIAGNOSIS — M5116 Intervertebral disc disorders with radiculopathy, lumbar region: Secondary | ICD-10-CM | POA: Diagnosis not present

## 2016-12-07 DIAGNOSIS — M961 Postlaminectomy syndrome, not elsewhere classified: Secondary | ICD-10-CM | POA: Diagnosis not present

## 2016-12-07 DIAGNOSIS — Z4689 Encounter for fitting and adjustment of other specified devices: Secondary | ICD-10-CM | POA: Diagnosis not present

## 2016-12-07 DIAGNOSIS — M4726 Other spondylosis with radiculopathy, lumbar region: Secondary | ICD-10-CM | POA: Diagnosis not present

## 2016-12-14 DIAGNOSIS — R9431 Abnormal electrocardiogram [ECG] [EKG]: Secondary | ICD-10-CM | POA: Diagnosis not present

## 2016-12-14 DIAGNOSIS — I517 Cardiomegaly: Secondary | ICD-10-CM | POA: Diagnosis not present

## 2016-12-14 DIAGNOSIS — Z01810 Encounter for preprocedural cardiovascular examination: Secondary | ICD-10-CM | POA: Diagnosis not present

## 2016-12-18 DIAGNOSIS — F419 Anxiety disorder, unspecified: Secondary | ICD-10-CM | POA: Diagnosis present

## 2016-12-18 DIAGNOSIS — I10 Essential (primary) hypertension: Secondary | ICD-10-CM | POA: Diagnosis present

## 2016-12-18 DIAGNOSIS — K219 Gastro-esophageal reflux disease without esophagitis: Secondary | ICD-10-CM | POA: Diagnosis present

## 2016-12-18 DIAGNOSIS — M542 Cervicalgia: Secondary | ICD-10-CM | POA: Diagnosis present

## 2016-12-18 DIAGNOSIS — M5432 Sciatica, left side: Secondary | ICD-10-CM | POA: Diagnosis not present

## 2016-12-18 DIAGNOSIS — G43909 Migraine, unspecified, not intractable, without status migrainosus: Secondary | ICD-10-CM | POA: Diagnosis present

## 2016-12-18 DIAGNOSIS — F329 Major depressive disorder, single episode, unspecified: Secondary | ICD-10-CM | POA: Diagnosis present

## 2016-12-18 DIAGNOSIS — M48062 Spinal stenosis, lumbar region with neurogenic claudication: Secondary | ICD-10-CM | POA: Diagnosis not present

## 2016-12-18 DIAGNOSIS — M4326 Fusion of spine, lumbar region: Secondary | ICD-10-CM | POA: Diagnosis not present

## 2016-12-18 DIAGNOSIS — M4726 Other spondylosis with radiculopathy, lumbar region: Secondary | ICD-10-CM | POA: Diagnosis not present

## 2016-12-18 DIAGNOSIS — M961 Postlaminectomy syndrome, not elsewhere classified: Secondary | ICD-10-CM | POA: Diagnosis not present

## 2016-12-18 DIAGNOSIS — M5116 Intervertebral disc disorders with radiculopathy, lumbar region: Secondary | ICD-10-CM | POA: Diagnosis not present

## 2016-12-18 DIAGNOSIS — Z880 Allergy status to penicillin: Secondary | ICD-10-CM | POA: Diagnosis not present

## 2016-12-21 DIAGNOSIS — M4326 Fusion of spine, lumbar region: Secondary | ICD-10-CM | POA: Diagnosis not present

## 2016-12-23 DIAGNOSIS — M961 Postlaminectomy syndrome, not elsewhere classified: Secondary | ICD-10-CM | POA: Diagnosis not present

## 2016-12-23 DIAGNOSIS — I10 Essential (primary) hypertension: Secondary | ICD-10-CM | POA: Diagnosis not present

## 2016-12-23 DIAGNOSIS — F419 Anxiety disorder, unspecified: Secondary | ICD-10-CM | POA: Diagnosis not present

## 2016-12-23 DIAGNOSIS — Z4789 Encounter for other orthopedic aftercare: Secondary | ICD-10-CM | POA: Diagnosis not present

## 2016-12-24 DIAGNOSIS — Z4789 Encounter for other orthopedic aftercare: Secondary | ICD-10-CM | POA: Diagnosis not present

## 2016-12-24 DIAGNOSIS — I10 Essential (primary) hypertension: Secondary | ICD-10-CM | POA: Diagnosis not present

## 2016-12-24 DIAGNOSIS — F419 Anxiety disorder, unspecified: Secondary | ICD-10-CM | POA: Diagnosis not present

## 2016-12-24 DIAGNOSIS — M961 Postlaminectomy syndrome, not elsewhere classified: Secondary | ICD-10-CM | POA: Diagnosis not present

## 2016-12-25 DIAGNOSIS — R590 Localized enlarged lymph nodes: Secondary | ICD-10-CM | POA: Diagnosis not present

## 2016-12-25 DIAGNOSIS — M7989 Other specified soft tissue disorders: Secondary | ICD-10-CM | POA: Diagnosis not present

## 2016-12-25 DIAGNOSIS — M545 Low back pain: Secondary | ICD-10-CM | POA: Diagnosis not present

## 2016-12-25 DIAGNOSIS — R600 Localized edema: Secondary | ICD-10-CM | POA: Diagnosis not present

## 2016-12-26 DIAGNOSIS — F419 Anxiety disorder, unspecified: Secondary | ICD-10-CM | POA: Diagnosis not present

## 2016-12-26 DIAGNOSIS — Z4789 Encounter for other orthopedic aftercare: Secondary | ICD-10-CM | POA: Diagnosis not present

## 2016-12-26 DIAGNOSIS — M961 Postlaminectomy syndrome, not elsewhere classified: Secondary | ICD-10-CM | POA: Diagnosis not present

## 2016-12-26 DIAGNOSIS — I10 Essential (primary) hypertension: Secondary | ICD-10-CM | POA: Diagnosis not present

## 2016-12-28 DIAGNOSIS — F419 Anxiety disorder, unspecified: Secondary | ICD-10-CM | POA: Diagnosis not present

## 2016-12-28 DIAGNOSIS — M961 Postlaminectomy syndrome, not elsewhere classified: Secondary | ICD-10-CM | POA: Diagnosis not present

## 2016-12-28 DIAGNOSIS — Z4789 Encounter for other orthopedic aftercare: Secondary | ICD-10-CM | POA: Diagnosis not present

## 2016-12-28 DIAGNOSIS — I10 Essential (primary) hypertension: Secondary | ICD-10-CM | POA: Diagnosis not present

## 2016-12-31 DIAGNOSIS — Z6833 Body mass index (BMI) 33.0-33.9, adult: Secondary | ICD-10-CM | POA: Diagnosis not present

## 2016-12-31 DIAGNOSIS — E669 Obesity, unspecified: Secondary | ICD-10-CM | POA: Diagnosis not present

## 2016-12-31 DIAGNOSIS — I517 Cardiomegaly: Secondary | ICD-10-CM | POA: Diagnosis not present

## 2016-12-31 DIAGNOSIS — I10 Essential (primary) hypertension: Secondary | ICD-10-CM | POA: Diagnosis not present

## 2016-12-31 DIAGNOSIS — Z79899 Other long term (current) drug therapy: Secondary | ICD-10-CM | POA: Diagnosis not present

## 2016-12-31 DIAGNOSIS — F419 Anxiety disorder, unspecified: Secondary | ICD-10-CM | POA: Diagnosis not present

## 2016-12-31 DIAGNOSIS — G4700 Insomnia, unspecified: Secondary | ICD-10-CM | POA: Diagnosis not present

## 2016-12-31 DIAGNOSIS — R739 Hyperglycemia, unspecified: Secondary | ICD-10-CM | POA: Diagnosis not present

## 2016-12-31 DIAGNOSIS — R600 Localized edema: Secondary | ICD-10-CM | POA: Diagnosis not present

## 2017-01-01 DIAGNOSIS — Z4789 Encounter for other orthopedic aftercare: Secondary | ICD-10-CM | POA: Diagnosis not present

## 2017-01-01 DIAGNOSIS — F419 Anxiety disorder, unspecified: Secondary | ICD-10-CM | POA: Diagnosis not present

## 2017-01-01 DIAGNOSIS — M961 Postlaminectomy syndrome, not elsewhere classified: Secondary | ICD-10-CM | POA: Diagnosis not present

## 2017-01-01 DIAGNOSIS — I10 Essential (primary) hypertension: Secondary | ICD-10-CM | POA: Diagnosis not present

## 2017-01-02 DIAGNOSIS — I10 Essential (primary) hypertension: Secondary | ICD-10-CM | POA: Diagnosis not present

## 2017-01-02 DIAGNOSIS — Z4789 Encounter for other orthopedic aftercare: Secondary | ICD-10-CM | POA: Diagnosis not present

## 2017-01-02 DIAGNOSIS — M961 Postlaminectomy syndrome, not elsewhere classified: Secondary | ICD-10-CM | POA: Diagnosis not present

## 2017-01-02 DIAGNOSIS — F419 Anxiety disorder, unspecified: Secondary | ICD-10-CM | POA: Diagnosis not present

## 2017-01-03 DIAGNOSIS — I10 Essential (primary) hypertension: Secondary | ICD-10-CM | POA: Diagnosis not present

## 2017-01-03 DIAGNOSIS — Z4789 Encounter for other orthopedic aftercare: Secondary | ICD-10-CM | POA: Diagnosis not present

## 2017-01-03 DIAGNOSIS — F419 Anxiety disorder, unspecified: Secondary | ICD-10-CM | POA: Diagnosis not present

## 2017-01-03 DIAGNOSIS — M961 Postlaminectomy syndrome, not elsewhere classified: Secondary | ICD-10-CM | POA: Diagnosis not present

## 2017-01-04 DIAGNOSIS — I10 Essential (primary) hypertension: Secondary | ICD-10-CM | POA: Diagnosis not present

## 2017-01-04 DIAGNOSIS — Z4789 Encounter for other orthopedic aftercare: Secondary | ICD-10-CM | POA: Diagnosis not present

## 2017-01-04 DIAGNOSIS — F419 Anxiety disorder, unspecified: Secondary | ICD-10-CM | POA: Diagnosis not present

## 2017-01-04 DIAGNOSIS — M961 Postlaminectomy syndrome, not elsewhere classified: Secondary | ICD-10-CM | POA: Diagnosis not present

## 2017-01-05 DIAGNOSIS — M79672 Pain in left foot: Secondary | ICD-10-CM | POA: Diagnosis not present

## 2017-01-05 DIAGNOSIS — E785 Hyperlipidemia, unspecified: Secondary | ICD-10-CM | POA: Diagnosis not present

## 2017-01-05 DIAGNOSIS — M79671 Pain in right foot: Secondary | ICD-10-CM | POA: Diagnosis not present

## 2017-01-05 DIAGNOSIS — Z79899 Other long term (current) drug therapy: Secondary | ICD-10-CM | POA: Diagnosis not present

## 2017-01-05 DIAGNOSIS — Z885 Allergy status to narcotic agent status: Secondary | ICD-10-CM | POA: Diagnosis not present

## 2017-01-05 DIAGNOSIS — R600 Localized edema: Secondary | ICD-10-CM | POA: Diagnosis not present

## 2017-01-05 DIAGNOSIS — M19072 Primary osteoarthritis, left ankle and foot: Secondary | ICD-10-CM | POA: Diagnosis not present

## 2017-01-05 DIAGNOSIS — Z880 Allergy status to penicillin: Secondary | ICD-10-CM | POA: Diagnosis not present

## 2017-01-05 DIAGNOSIS — Z9889 Other specified postprocedural states: Secondary | ICD-10-CM | POA: Diagnosis not present

## 2017-01-05 DIAGNOSIS — Z87891 Personal history of nicotine dependence: Secondary | ICD-10-CM | POA: Diagnosis not present

## 2017-01-05 DIAGNOSIS — D72829 Elevated white blood cell count, unspecified: Secondary | ICD-10-CM | POA: Diagnosis not present

## 2017-01-05 DIAGNOSIS — I10 Essential (primary) hypertension: Secondary | ICD-10-CM | POA: Diagnosis not present

## 2017-01-05 DIAGNOSIS — M7989 Other specified soft tissue disorders: Secondary | ICD-10-CM | POA: Diagnosis not present

## 2017-01-05 DIAGNOSIS — Z888 Allergy status to other drugs, medicaments and biological substances status: Secondary | ICD-10-CM | POA: Diagnosis not present

## 2017-01-09 DIAGNOSIS — F419 Anxiety disorder, unspecified: Secondary | ICD-10-CM | POA: Diagnosis not present

## 2017-01-09 DIAGNOSIS — Z4789 Encounter for other orthopedic aftercare: Secondary | ICD-10-CM | POA: Diagnosis not present

## 2017-01-09 DIAGNOSIS — M961 Postlaminectomy syndrome, not elsewhere classified: Secondary | ICD-10-CM | POA: Diagnosis not present

## 2017-01-09 DIAGNOSIS — I10 Essential (primary) hypertension: Secondary | ICD-10-CM | POA: Diagnosis not present

## 2017-01-11 DIAGNOSIS — I10 Essential (primary) hypertension: Secondary | ICD-10-CM | POA: Diagnosis not present

## 2017-01-11 DIAGNOSIS — F419 Anxiety disorder, unspecified: Secondary | ICD-10-CM | POA: Diagnosis not present

## 2017-01-11 DIAGNOSIS — M961 Postlaminectomy syndrome, not elsewhere classified: Secondary | ICD-10-CM | POA: Diagnosis not present

## 2017-01-11 DIAGNOSIS — Z4789 Encounter for other orthopedic aftercare: Secondary | ICD-10-CM | POA: Diagnosis not present

## 2017-01-14 DIAGNOSIS — M961 Postlaminectomy syndrome, not elsewhere classified: Secondary | ICD-10-CM | POA: Diagnosis not present

## 2017-01-14 DIAGNOSIS — Z4789 Encounter for other orthopedic aftercare: Secondary | ICD-10-CM | POA: Diagnosis not present

## 2017-01-14 DIAGNOSIS — F419 Anxiety disorder, unspecified: Secondary | ICD-10-CM | POA: Diagnosis not present

## 2017-01-14 DIAGNOSIS — I10 Essential (primary) hypertension: Secondary | ICD-10-CM | POA: Diagnosis not present

## 2017-01-16 DIAGNOSIS — M961 Postlaminectomy syndrome, not elsewhere classified: Secondary | ICD-10-CM | POA: Diagnosis not present

## 2017-01-16 DIAGNOSIS — F419 Anxiety disorder, unspecified: Secondary | ICD-10-CM | POA: Diagnosis not present

## 2017-01-16 DIAGNOSIS — Z4789 Encounter for other orthopedic aftercare: Secondary | ICD-10-CM | POA: Diagnosis not present

## 2017-01-16 DIAGNOSIS — I10 Essential (primary) hypertension: Secondary | ICD-10-CM | POA: Diagnosis not present

## 2017-01-18 DIAGNOSIS — M961 Postlaminectomy syndrome, not elsewhere classified: Secondary | ICD-10-CM | POA: Diagnosis not present

## 2017-01-18 DIAGNOSIS — I10 Essential (primary) hypertension: Secondary | ICD-10-CM | POA: Diagnosis not present

## 2017-01-18 DIAGNOSIS — Z4789 Encounter for other orthopedic aftercare: Secondary | ICD-10-CM | POA: Diagnosis not present

## 2017-01-18 DIAGNOSIS — F419 Anxiety disorder, unspecified: Secondary | ICD-10-CM | POA: Diagnosis not present

## 2017-01-22 DIAGNOSIS — M961 Postlaminectomy syndrome, not elsewhere classified: Secondary | ICD-10-CM | POA: Diagnosis not present

## 2017-01-22 DIAGNOSIS — I10 Essential (primary) hypertension: Secondary | ICD-10-CM | POA: Diagnosis not present

## 2017-01-22 DIAGNOSIS — Z4789 Encounter for other orthopedic aftercare: Secondary | ICD-10-CM | POA: Diagnosis not present

## 2017-01-22 DIAGNOSIS — F419 Anxiety disorder, unspecified: Secondary | ICD-10-CM | POA: Diagnosis not present

## 2017-01-24 DIAGNOSIS — M4726 Other spondylosis with radiculopathy, lumbar region: Secondary | ICD-10-CM | POA: Diagnosis not present

## 2017-01-24 DIAGNOSIS — M5116 Intervertebral disc disorders with radiculopathy, lumbar region: Secondary | ICD-10-CM | POA: Diagnosis not present

## 2017-01-24 DIAGNOSIS — M5416 Radiculopathy, lumbar region: Secondary | ICD-10-CM | POA: Diagnosis not present

## 2017-01-24 DIAGNOSIS — Z981 Arthrodesis status: Secondary | ICD-10-CM | POA: Diagnosis not present

## 2017-01-25 DIAGNOSIS — M961 Postlaminectomy syndrome, not elsewhere classified: Secondary | ICD-10-CM | POA: Diagnosis not present

## 2017-01-25 DIAGNOSIS — I10 Essential (primary) hypertension: Secondary | ICD-10-CM | POA: Diagnosis not present

## 2017-01-25 DIAGNOSIS — F419 Anxiety disorder, unspecified: Secondary | ICD-10-CM | POA: Diagnosis not present

## 2017-01-25 DIAGNOSIS — Z4789 Encounter for other orthopedic aftercare: Secondary | ICD-10-CM | POA: Diagnosis not present

## 2017-01-29 DIAGNOSIS — F419 Anxiety disorder, unspecified: Secondary | ICD-10-CM | POA: Diagnosis not present

## 2017-01-29 DIAGNOSIS — M961 Postlaminectomy syndrome, not elsewhere classified: Secondary | ICD-10-CM | POA: Diagnosis not present

## 2017-01-29 DIAGNOSIS — Z4789 Encounter for other orthopedic aftercare: Secondary | ICD-10-CM | POA: Diagnosis not present

## 2017-01-29 DIAGNOSIS — I10 Essential (primary) hypertension: Secondary | ICD-10-CM | POA: Diagnosis not present

## 2017-02-01 DIAGNOSIS — F419 Anxiety disorder, unspecified: Secondary | ICD-10-CM | POA: Diagnosis not present

## 2017-02-01 DIAGNOSIS — Z4789 Encounter for other orthopedic aftercare: Secondary | ICD-10-CM | POA: Diagnosis not present

## 2017-02-01 DIAGNOSIS — M961 Postlaminectomy syndrome, not elsewhere classified: Secondary | ICD-10-CM | POA: Diagnosis not present

## 2017-02-01 DIAGNOSIS — I10 Essential (primary) hypertension: Secondary | ICD-10-CM | POA: Diagnosis not present

## 2017-02-12 DIAGNOSIS — Z4789 Encounter for other orthopedic aftercare: Secondary | ICD-10-CM | POA: Diagnosis not present

## 2017-02-12 DIAGNOSIS — S92252D Displaced fracture of navicular [scaphoid] of left foot, subsequent encounter for fracture with routine healing: Secondary | ICD-10-CM | POA: Diagnosis not present

## 2017-02-12 DIAGNOSIS — S92334D Nondisplaced fracture of third metatarsal bone, right foot, subsequent encounter for fracture with routine healing: Secondary | ICD-10-CM | POA: Diagnosis not present

## 2017-02-12 DIAGNOSIS — S92252P Displaced fracture of navicular [scaphoid] of left foot, subsequent encounter for fracture with malunion: Secondary | ICD-10-CM | POA: Diagnosis not present

## 2017-02-13 DIAGNOSIS — I10 Essential (primary) hypertension: Secondary | ICD-10-CM | POA: Diagnosis not present

## 2017-02-13 DIAGNOSIS — Z4789 Encounter for other orthopedic aftercare: Secondary | ICD-10-CM | POA: Diagnosis not present

## 2017-02-13 DIAGNOSIS — M961 Postlaminectomy syndrome, not elsewhere classified: Secondary | ICD-10-CM | POA: Diagnosis not present

## 2017-02-13 DIAGNOSIS — F419 Anxiety disorder, unspecified: Secondary | ICD-10-CM | POA: Diagnosis not present

## 2017-02-26 DIAGNOSIS — Z79899 Other long term (current) drug therapy: Secondary | ICD-10-CM | POA: Diagnosis not present

## 2017-02-26 DIAGNOSIS — Z87891 Personal history of nicotine dependence: Secondary | ICD-10-CM | POA: Diagnosis not present

## 2017-02-26 DIAGNOSIS — M19172 Post-traumatic osteoarthritis, left ankle and foot: Secondary | ICD-10-CM | POA: Diagnosis not present

## 2017-02-26 DIAGNOSIS — I10 Essential (primary) hypertension: Secondary | ICD-10-CM | POA: Diagnosis not present

## 2017-02-26 DIAGNOSIS — M21862 Other specified acquired deformities of left lower leg: Secondary | ICD-10-CM | POA: Diagnosis not present

## 2017-02-26 DIAGNOSIS — E785 Hyperlipidemia, unspecified: Secondary | ICD-10-CM | POA: Diagnosis not present

## 2017-02-26 DIAGNOSIS — Z885 Allergy status to narcotic agent status: Secondary | ICD-10-CM | POA: Diagnosis not present

## 2017-02-26 DIAGNOSIS — Z880 Allergy status to penicillin: Secondary | ICD-10-CM | POA: Diagnosis not present

## 2017-02-26 DIAGNOSIS — Z981 Arthrodesis status: Secondary | ICD-10-CM | POA: Diagnosis not present

## 2017-02-26 DIAGNOSIS — Z886 Allergy status to analgesic agent status: Secondary | ICD-10-CM | POA: Diagnosis not present

## 2017-02-26 DIAGNOSIS — S92252D Displaced fracture of navicular [scaphoid] of left foot, subsequent encounter for fracture with routine healing: Secondary | ICD-10-CM | POA: Diagnosis not present

## 2017-03-18 DIAGNOSIS — M961 Postlaminectomy syndrome, not elsewhere classified: Secondary | ICD-10-CM | POA: Diagnosis not present

## 2017-04-19 DIAGNOSIS — M961 Postlaminectomy syndrome, not elsewhere classified: Secondary | ICD-10-CM | POA: Diagnosis not present

## 2017-04-19 DIAGNOSIS — M4726 Other spondylosis with radiculopathy, lumbar region: Secondary | ICD-10-CM | POA: Diagnosis not present

## 2017-05-27 DIAGNOSIS — M5431 Sciatica, right side: Secondary | ICD-10-CM | POA: Diagnosis not present

## 2017-05-27 DIAGNOSIS — M4726 Other spondylosis with radiculopathy, lumbar region: Secondary | ICD-10-CM | POA: Diagnosis not present

## 2017-05-27 DIAGNOSIS — M961 Postlaminectomy syndrome, not elsewhere classified: Secondary | ICD-10-CM | POA: Diagnosis not present

## 2017-05-27 DIAGNOSIS — M5432 Sciatica, left side: Secondary | ICD-10-CM | POA: Diagnosis not present

## 2017-05-31 DIAGNOSIS — T50904A Poisoning by unspecified drugs, medicaments and biological substances, undetermined, initial encounter: Secondary | ICD-10-CM | POA: Diagnosis not present

## 2017-05-31 DIAGNOSIS — T1490XA Injury, unspecified, initial encounter: Secondary | ICD-10-CM | POA: Diagnosis not present

## 2017-06-11 DIAGNOSIS — Z6829 Body mass index (BMI) 29.0-29.9, adult: Secondary | ICD-10-CM | POA: Diagnosis not present

## 2017-06-11 DIAGNOSIS — M19072 Primary osteoarthritis, left ankle and foot: Secondary | ICD-10-CM | POA: Diagnosis not present

## 2017-06-11 DIAGNOSIS — Z886 Allergy status to analgesic agent status: Secondary | ICD-10-CM | POA: Diagnosis not present

## 2017-06-11 DIAGNOSIS — M5432 Sciatica, left side: Secondary | ICD-10-CM | POA: Diagnosis not present

## 2017-06-11 DIAGNOSIS — S92252D Displaced fracture of navicular [scaphoid] of left foot, subsequent encounter for fracture with routine healing: Secondary | ICD-10-CM | POA: Diagnosis not present

## 2017-06-11 DIAGNOSIS — M5431 Sciatica, right side: Secondary | ICD-10-CM | POA: Diagnosis not present

## 2017-06-11 DIAGNOSIS — Z880 Allergy status to penicillin: Secondary | ICD-10-CM | POA: Diagnosis not present

## 2017-06-11 DIAGNOSIS — M961 Postlaminectomy syndrome, not elsewhere classified: Secondary | ICD-10-CM | POA: Diagnosis not present

## 2017-06-11 DIAGNOSIS — Z885 Allergy status to narcotic agent status: Secondary | ICD-10-CM | POA: Diagnosis not present

## 2017-06-11 DIAGNOSIS — M4726 Other spondylosis with radiculopathy, lumbar region: Secondary | ICD-10-CM | POA: Diagnosis not present

## 2017-06-11 DIAGNOSIS — M21862 Other specified acquired deformities of left lower leg: Secondary | ICD-10-CM | POA: Diagnosis not present

## 2017-06-11 DIAGNOSIS — Z888 Allergy status to other drugs, medicaments and biological substances status: Secondary | ICD-10-CM | POA: Diagnosis not present

## 2017-06-14 DIAGNOSIS — S92252D Displaced fracture of navicular [scaphoid] of left foot, subsequent encounter for fracture with routine healing: Secondary | ICD-10-CM | POA: Diagnosis not present

## 2017-06-14 DIAGNOSIS — M19072 Primary osteoarthritis, left ankle and foot: Secondary | ICD-10-CM | POA: Diagnosis not present

## 2017-06-14 DIAGNOSIS — Z981 Arthrodesis status: Secondary | ICD-10-CM | POA: Diagnosis not present

## 2017-06-14 DIAGNOSIS — M21862 Other specified acquired deformities of left lower leg: Secondary | ICD-10-CM | POA: Diagnosis not present

## 2017-06-14 DIAGNOSIS — Z79891 Long term (current) use of opiate analgesic: Secondary | ICD-10-CM | POA: Diagnosis not present

## 2017-06-14 DIAGNOSIS — M545 Low back pain: Secondary | ICD-10-CM | POA: Diagnosis not present

## 2017-06-14 DIAGNOSIS — G8918 Other acute postprocedural pain: Secondary | ICD-10-CM | POA: Diagnosis not present

## 2017-07-03 DIAGNOSIS — S92252D Displaced fracture of navicular [scaphoid] of left foot, subsequent encounter for fracture with routine healing: Secondary | ICD-10-CM | POA: Diagnosis not present

## 2017-07-03 DIAGNOSIS — Z880 Allergy status to penicillin: Secondary | ICD-10-CM | POA: Diagnosis not present

## 2017-07-03 DIAGNOSIS — Z888 Allergy status to other drugs, medicaments and biological substances status: Secondary | ICD-10-CM | POA: Diagnosis not present

## 2017-07-03 DIAGNOSIS — Z981 Arthrodesis status: Secondary | ICD-10-CM | POA: Diagnosis not present

## 2017-07-03 DIAGNOSIS — Z4802 Encounter for removal of sutures: Secondary | ICD-10-CM | POA: Diagnosis not present

## 2017-07-03 DIAGNOSIS — Z885 Allergy status to narcotic agent status: Secondary | ICD-10-CM | POA: Diagnosis not present

## 2017-07-30 DIAGNOSIS — Z4789 Encounter for other orthopedic aftercare: Secondary | ICD-10-CM | POA: Diagnosis not present

## 2017-07-30 DIAGNOSIS — M21862 Other specified acquired deformities of left lower leg: Secondary | ICD-10-CM | POA: Diagnosis not present

## 2017-07-30 DIAGNOSIS — S92252D Displaced fracture of navicular [scaphoid] of left foot, subsequent encounter for fracture with routine healing: Secondary | ICD-10-CM | POA: Diagnosis not present

## 2017-07-30 DIAGNOSIS — M7989 Other specified soft tissue disorders: Secondary | ICD-10-CM | POA: Diagnosis not present

## 2017-07-30 DIAGNOSIS — M19072 Primary osteoarthritis, left ankle and foot: Secondary | ICD-10-CM | POA: Diagnosis not present

## 2017-09-17 DIAGNOSIS — R52 Pain, unspecified: Secondary | ICD-10-CM | POA: Diagnosis not present

## 2017-09-17 DIAGNOSIS — Z981 Arthrodesis status: Secondary | ICD-10-CM | POA: Diagnosis not present

## 2017-09-17 DIAGNOSIS — R509 Fever, unspecified: Secondary | ICD-10-CM | POA: Diagnosis not present

## 2017-09-17 DIAGNOSIS — A419 Sepsis, unspecified organism: Secondary | ICD-10-CM | POA: Diagnosis not present

## 2017-09-17 DIAGNOSIS — Z743 Need for continuous supervision: Secondary | ICD-10-CM | POA: Diagnosis not present

## 2017-09-17 DIAGNOSIS — R000 Tachycardia, unspecified: Secondary | ICD-10-CM | POA: Diagnosis not present

## 2017-09-17 DIAGNOSIS — M25475 Effusion, left foot: Secondary | ICD-10-CM | POA: Diagnosis not present

## 2017-09-17 DIAGNOSIS — R279 Unspecified lack of coordination: Secondary | ICD-10-CM | POA: Diagnosis not present

## 2017-09-17 DIAGNOSIS — M7989 Other specified soft tissue disorders: Secondary | ICD-10-CM | POA: Diagnosis not present

## 2017-09-17 DIAGNOSIS — M25472 Effusion, left ankle: Secondary | ICD-10-CM | POA: Diagnosis not present

## 2017-09-17 DIAGNOSIS — M79672 Pain in left foot: Secondary | ICD-10-CM | POA: Diagnosis not present

## 2017-09-23 DIAGNOSIS — M79605 Pain in left leg: Secondary | ICD-10-CM | POA: Diagnosis not present

## 2017-09-23 DIAGNOSIS — S92252S Displaced fracture of navicular [scaphoid] of left foot, sequela: Secondary | ICD-10-CM | POA: Diagnosis not present

## 2017-09-23 DIAGNOSIS — T07XXXA Unspecified multiple injuries, initial encounter: Secondary | ICD-10-CM | POA: Diagnosis not present

## 2017-09-23 DIAGNOSIS — M7989 Other specified soft tissue disorders: Secondary | ICD-10-CM | POA: Diagnosis not present

## 2017-09-25 DIAGNOSIS — M7989 Other specified soft tissue disorders: Secondary | ICD-10-CM | POA: Diagnosis not present

## 2017-09-25 DIAGNOSIS — M79672 Pain in left foot: Secondary | ICD-10-CM | POA: Diagnosis not present

## 2017-09-25 DIAGNOSIS — L089 Local infection of the skin and subcutaneous tissue, unspecified: Secondary | ICD-10-CM | POA: Diagnosis not present

## 2017-09-25 DIAGNOSIS — M799 Soft tissue disorder, unspecified: Secondary | ICD-10-CM | POA: Diagnosis not present

## 2018-05-08 DIAGNOSIS — Z8669 Personal history of other diseases of the nervous system and sense organs: Secondary | ICD-10-CM | POA: Diagnosis not present

## 2018-05-08 DIAGNOSIS — M5442 Lumbago with sciatica, left side: Secondary | ICD-10-CM | POA: Diagnosis not present

## 2018-05-08 DIAGNOSIS — G894 Chronic pain syndrome: Secondary | ICD-10-CM | POA: Diagnosis not present

## 2018-05-08 DIAGNOSIS — I10 Essential (primary) hypertension: Secondary | ICD-10-CM | POA: Diagnosis not present

## 2018-05-08 DIAGNOSIS — M5136 Other intervertebral disc degeneration, lumbar region: Secondary | ICD-10-CM | POA: Diagnosis not present

## 2018-05-08 DIAGNOSIS — F411 Generalized anxiety disorder: Secondary | ICD-10-CM | POA: Diagnosis not present

## 2018-05-08 DIAGNOSIS — E785 Hyperlipidemia, unspecified: Secondary | ICD-10-CM | POA: Diagnosis not present

## 2018-05-08 DIAGNOSIS — R000 Tachycardia, unspecified: Secondary | ICD-10-CM | POA: Diagnosis not present

## 2018-05-08 DIAGNOSIS — G8929 Other chronic pain: Secondary | ICD-10-CM | POA: Diagnosis not present

## 2018-05-08 DIAGNOSIS — Z8659 Personal history of other mental and behavioral disorders: Secondary | ICD-10-CM | POA: Diagnosis not present

## 2018-06-11 DIAGNOSIS — M5136 Other intervertebral disc degeneration, lumbar region: Secondary | ICD-10-CM | POA: Diagnosis not present

## 2018-06-11 DIAGNOSIS — F17210 Nicotine dependence, cigarettes, uncomplicated: Secondary | ICD-10-CM | POA: Diagnosis not present

## 2018-06-11 DIAGNOSIS — I4510 Unspecified right bundle-branch block: Secondary | ICD-10-CM | POA: Diagnosis not present

## 2018-06-11 DIAGNOSIS — E785 Hyperlipidemia, unspecified: Secondary | ICD-10-CM | POA: Diagnosis not present

## 2018-06-11 DIAGNOSIS — T50904A Poisoning by unspecified drugs, medicaments and biological substances, undetermined, initial encounter: Secondary | ICD-10-CM | POA: Diagnosis not present

## 2018-06-11 DIAGNOSIS — R9431 Abnormal electrocardiogram [ECG] [EKG]: Secondary | ICD-10-CM | POA: Diagnosis not present

## 2018-06-11 DIAGNOSIS — Z79899 Other long term (current) drug therapy: Secondary | ICD-10-CM | POA: Diagnosis not present

## 2018-06-11 DIAGNOSIS — Z880 Allergy status to penicillin: Secondary | ICD-10-CM | POA: Diagnosis not present

## 2018-06-11 DIAGNOSIS — I10 Essential (primary) hypertension: Secondary | ICD-10-CM | POA: Diagnosis not present

## 2018-06-11 DIAGNOSIS — T40601A Poisoning by unspecified narcotics, accidental (unintentional), initial encounter: Secondary | ICD-10-CM | POA: Diagnosis not present

## 2018-06-11 DIAGNOSIS — F419 Anxiety disorder, unspecified: Secondary | ICD-10-CM | POA: Diagnosis not present

## 2018-06-11 DIAGNOSIS — Z79891 Long term (current) use of opiate analgesic: Secondary | ICD-10-CM | POA: Diagnosis not present

## 2018-07-02 DIAGNOSIS — 419620001 Death: Secondary | SNOMED CT | POA: Diagnosis not present

## 2018-07-02 DEATH — deceased
